# Patient Record
Sex: Female | Born: 1985 | Race: White | Hispanic: No | Marital: Married | Smoking: Never smoker
Health system: Southern US, Community
[De-identification: ages and names within clinical notes are randomized; demographics above are authoritative.]

## PROBLEM LIST (undated history)

## (undated) ENCOUNTER — Inpatient Hospital Stay (HOSPITAL_COMMUNITY): Payer: Self-pay

## (undated) DIAGNOSIS — Z8711 Personal history of peptic ulcer disease: Secondary | ICD-10-CM

## (undated) DIAGNOSIS — N201 Calculus of ureter: Secondary | ICD-10-CM

## (undated) DIAGNOSIS — Z87442 Personal history of urinary calculi: Secondary | ICD-10-CM

## (undated) DIAGNOSIS — Z8719 Personal history of other diseases of the digestive system: Secondary | ICD-10-CM

---
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
## 2005-11-02 ENCOUNTER — Emergency Department (HOSPITAL_COMMUNITY): Admission: EM | Admit: 2005-11-02 | Discharge: 2005-11-02 | Payer: Self-pay | Admitting: Emergency Medicine

## 2005-11-16 ENCOUNTER — Emergency Department (HOSPITAL_COMMUNITY): Admission: EM | Admit: 2005-11-16 | Discharge: 2005-11-16 | Payer: Self-pay | Admitting: Emergency Medicine

## 2005-11-24 ENCOUNTER — Emergency Department (HOSPITAL_COMMUNITY): Admission: EM | Admit: 2005-11-24 | Discharge: 2005-11-24 | Payer: Self-pay | Admitting: Emergency Medicine

## 2006-10-18 ENCOUNTER — Emergency Department (HOSPITAL_COMMUNITY): Admission: EM | Admit: 2006-10-18 | Discharge: 2006-10-18 | Payer: Self-pay | Admitting: Emergency Medicine

## 2006-11-22 ENCOUNTER — Emergency Department (HOSPITAL_COMMUNITY): Admission: EM | Admit: 2006-11-22 | Discharge: 2006-11-22 | Payer: Self-pay | Admitting: Family Medicine

## 2007-03-12 ENCOUNTER — Emergency Department (HOSPITAL_COMMUNITY): Admission: EM | Admit: 2007-03-12 | Discharge: 2007-03-12 | Payer: Self-pay | Admitting: Emergency Medicine

## 2007-10-16 ENCOUNTER — Ambulatory Visit (HOSPITAL_COMMUNITY): Admission: RE | Admit: 2007-10-16 | Discharge: 2007-10-16 | Payer: Self-pay | Admitting: Obstetrics and Gynecology

## 2007-10-31 ENCOUNTER — Inpatient Hospital Stay (HOSPITAL_COMMUNITY): Admission: AD | Admit: 2007-10-31 | Discharge: 2007-11-01 | Payer: Self-pay | Admitting: Obstetrics and Gynecology

## 2007-11-08 ENCOUNTER — Inpatient Hospital Stay (HOSPITAL_COMMUNITY): Admission: AD | Admit: 2007-11-08 | Discharge: 2007-11-08 | Payer: Self-pay | Admitting: Obstetrics and Gynecology

## 2007-11-19 ENCOUNTER — Inpatient Hospital Stay (HOSPITAL_COMMUNITY): Admission: AD | Admit: 2007-11-19 | Discharge: 2007-11-20 | Payer: Self-pay | Admitting: Obstetrics and Gynecology

## 2007-11-30 ENCOUNTER — Inpatient Hospital Stay (HOSPITAL_COMMUNITY): Admission: AD | Admit: 2007-11-30 | Discharge: 2007-11-30 | Payer: Self-pay | Admitting: Obstetrics and Gynecology

## 2007-12-03 ENCOUNTER — Inpatient Hospital Stay (HOSPITAL_COMMUNITY): Admission: AD | Admit: 2007-12-03 | Discharge: 2007-12-03 | Payer: Self-pay | Admitting: Obstetrics and Gynecology

## 2007-12-04 ENCOUNTER — Inpatient Hospital Stay (HOSPITAL_COMMUNITY): Admission: AD | Admit: 2007-12-04 | Discharge: 2007-12-04 | Payer: Self-pay | Admitting: Obstetrics & Gynecology

## 2007-12-05 ENCOUNTER — Inpatient Hospital Stay (HOSPITAL_COMMUNITY): Admission: AD | Admit: 2007-12-05 | Discharge: 2007-12-07 | Payer: Self-pay | Admitting: Obstetrics & Gynecology

## 2008-01-28 ENCOUNTER — Inpatient Hospital Stay (HOSPITAL_COMMUNITY): Admission: EM | Admit: 2008-01-28 | Discharge: 2008-01-31 | Payer: Self-pay | Admitting: Urology

## 2008-01-28 ENCOUNTER — Encounter: Payer: Self-pay | Admitting: Family Medicine

## 2008-01-28 HISTORY — PX: OTHER SURGICAL HISTORY: SHX169

## 2008-04-06 ENCOUNTER — Ambulatory Visit (HOSPITAL_BASED_OUTPATIENT_CLINIC_OR_DEPARTMENT_OTHER): Admission: RE | Admit: 2008-04-06 | Discharge: 2008-04-06 | Payer: Self-pay | Admitting: Urology

## 2008-04-06 HISTORY — PX: OTHER SURGICAL HISTORY: SHX169

## 2009-05-01 ENCOUNTER — Inpatient Hospital Stay (HOSPITAL_COMMUNITY): Admission: AD | Admit: 2009-05-01 | Discharge: 2009-05-01 | Payer: Self-pay | Admitting: Obstetrics and Gynecology

## 2009-06-09 ENCOUNTER — Inpatient Hospital Stay (HOSPITAL_COMMUNITY): Admission: AD | Admit: 2009-06-09 | Discharge: 2009-06-09 | Payer: Self-pay | Admitting: Obstetrics & Gynecology

## 2009-06-28 ENCOUNTER — Inpatient Hospital Stay (HOSPITAL_COMMUNITY): Admission: AD | Admit: 2009-06-28 | Discharge: 2009-06-28 | Payer: Self-pay | Admitting: Obstetrics & Gynecology

## 2009-07-25 ENCOUNTER — Inpatient Hospital Stay (HOSPITAL_COMMUNITY): Admission: AD | Admit: 2009-07-25 | Discharge: 2009-07-25 | Payer: Self-pay | Admitting: Obstetrics & Gynecology

## 2009-10-29 ENCOUNTER — Ambulatory Visit: Payer: Self-pay | Admitting: Physician Assistant

## 2009-10-29 ENCOUNTER — Inpatient Hospital Stay (HOSPITAL_COMMUNITY): Admission: AD | Admit: 2009-10-29 | Discharge: 2009-10-29 | Payer: Self-pay | Admitting: Obstetrics and Gynecology

## 2009-10-30 ENCOUNTER — Observation Stay (HOSPITAL_COMMUNITY): Admission: AD | Admit: 2009-10-30 | Discharge: 2009-10-30 | Payer: Self-pay | Admitting: Obstetrics and Gynecology

## 2009-11-09 ENCOUNTER — Inpatient Hospital Stay (HOSPITAL_COMMUNITY): Admission: AD | Admit: 2009-11-09 | Discharge: 2009-11-09 | Payer: Self-pay | Admitting: Obstetrics and Gynecology

## 2009-11-16 ENCOUNTER — Emergency Department (HOSPITAL_COMMUNITY): Admission: EM | Admit: 2009-11-16 | Discharge: 2009-11-16 | Payer: Self-pay | Admitting: Family Medicine

## 2009-11-20 ENCOUNTER — Inpatient Hospital Stay (HOSPITAL_COMMUNITY): Admission: AD | Admit: 2009-11-20 | Discharge: 2009-11-20 | Payer: Self-pay | Admitting: Obstetrics and Gynecology

## 2009-11-23 ENCOUNTER — Inpatient Hospital Stay (HOSPITAL_COMMUNITY): Admission: AD | Admit: 2009-11-23 | Discharge: 2009-11-23 | Payer: Self-pay | Admitting: Obstetrics and Gynecology

## 2009-11-30 ENCOUNTER — Inpatient Hospital Stay (HOSPITAL_COMMUNITY): Admission: AD | Admit: 2009-11-30 | Discharge: 2009-11-30 | Payer: Self-pay | Admitting: Obstetrics and Gynecology

## 2009-12-01 ENCOUNTER — Inpatient Hospital Stay (HOSPITAL_COMMUNITY): Admission: AD | Admit: 2009-12-01 | Discharge: 2009-12-01 | Payer: Self-pay | Admitting: Obstetrics and Gynecology

## 2009-12-03 ENCOUNTER — Inpatient Hospital Stay (HOSPITAL_COMMUNITY): Admission: AD | Admit: 2009-12-03 | Discharge: 2009-12-03 | Payer: Self-pay | Admitting: Obstetrics and Gynecology

## 2009-12-05 ENCOUNTER — Inpatient Hospital Stay (HOSPITAL_COMMUNITY): Admission: AD | Admit: 2009-12-05 | Discharge: 2009-12-08 | Payer: Self-pay | Admitting: Obstetrics and Gynecology

## 2010-02-07 ENCOUNTER — Emergency Department (HOSPITAL_COMMUNITY): Admission: EM | Admit: 2010-02-07 | Discharge: 2010-02-07 | Payer: Self-pay | Admitting: Family Medicine

## 2010-10-11 ENCOUNTER — Emergency Department (HOSPITAL_COMMUNITY)
Admission: EM | Admit: 2010-10-11 | Discharge: 2010-10-11 | Disposition: A | Discharge status: Home / Self Care | Attending: Emergency Medicine | Admitting: Emergency Medicine

## 2010-10-11 DIAGNOSIS — H6690 Otitis media, unspecified, unspecified ear: Secondary | ICD-10-CM | POA: Insufficient documentation

## 2010-10-11 DIAGNOSIS — J3489 Other specified disorders of nose and nasal sinuses: Secondary | ICD-10-CM | POA: Insufficient documentation

## 2010-10-11 DIAGNOSIS — H9190 Unspecified hearing loss, unspecified ear: Secondary | ICD-10-CM | POA: Insufficient documentation

## 2010-10-11 DIAGNOSIS — K219 Gastro-esophageal reflux disease without esophagitis: Secondary | ICD-10-CM | POA: Insufficient documentation

## 2010-10-11 DIAGNOSIS — H9209 Otalgia, unspecified ear: Secondary | ICD-10-CM | POA: Insufficient documentation

## 2010-10-30 LAB — URINALYSIS, ROUTINE W REFLEX MICROSCOPIC
Glucose, UA: NEGATIVE mg/dL
Leukocytes, UA: NEGATIVE
Leukocytes, UA: NEGATIVE
Nitrite: NEGATIVE
Protein, ur: >300 mg/dL — AB
Specific Gravity, Urine: >1.03 — ABNORMAL HIGH (ref 1.005–1.030)
Specific Gravity, Urine: >1.03 — ABNORMAL HIGH (ref 1.005–1.030)
Specific Gravity, Urine: >1.03 — ABNORMAL HIGH (ref 1.005–1.030)
Urobilinogen, UA: 0.2 mg/dL (ref 0.0–1.0)
Urobilinogen, UA: 0.2 mg/dL (ref 0.0–1.0)
pH: 6 (ref 5.0–8.0)
pH: 6 (ref 5.0–8.0)
pH: 6 (ref 5.0–8.0)

## 2010-10-30 LAB — RH IMMUNE GLOB WKUP(>/=20WKS)(NOT WOMEN'S HOSP)

## 2010-10-30 LAB — CBC
HCT: 23.8 % — ABNORMAL LOW (ref 36.0–46.0)
HCT: 28.5 % — ABNORMAL LOW (ref 36.0–46.0)
Hemoglobin: 9.4 g/dL — ABNORMAL LOW (ref 12.0–15.0)
Hemoglobin: 9.6 g/dL — ABNORMAL LOW (ref 12.0–15.0)
MCHC: 32.6 g/dL (ref 30.0–36.0)
MCHC: 32.9 g/dL (ref 30.0–36.0)
MCHC: 32.9 g/dL (ref 30.0–36.0)
MCV: 77.1 fL — ABNORMAL LOW (ref 78.0–100.0)
MCV: 78.3 fL (ref 78.0–100.0)
Platelets: 228 10*3/uL (ref 150–400)
Platelets: 247 10*3/uL (ref 150–400)
Platelets: 270 10*3/uL (ref 150–400)
Platelets: 283 10*3/uL (ref 150–400)
RBC: 3.72 MIL/uL — ABNORMAL LOW (ref 3.87–5.11)
RDW: 16.6 % — ABNORMAL HIGH (ref 11.5–15.5)
RDW: 16.6 % — ABNORMAL HIGH (ref 11.5–15.5)
RDW: 17.2 % — ABNORMAL HIGH (ref 11.5–15.5)
WBC: 11.6 10*3/uL — ABNORMAL HIGH (ref 4.0–10.5)
WBC: 11.9 10*3/uL — ABNORMAL HIGH (ref 4.0–10.5)
WBC: 13.6 10*3/uL — ABNORMAL HIGH (ref 4.0–10.5)

## 2010-10-30 LAB — COMPREHENSIVE METABOLIC PANEL
ALT: 27 U/L (ref 0–35)
AST: 28 U/L (ref 0–37)
AST: 28 U/L (ref 0–37)
AST: 40 U/L — ABNORMAL HIGH (ref 0–37)
Albumin: 2 g/dL — ABNORMAL LOW (ref 3.5–5.2)
Albumin: 2.5 g/dL — ABNORMAL LOW (ref 3.5–5.2)
Albumin: 2.7 g/dL — ABNORMAL LOW (ref 3.5–5.2)
Albumin: 2.7 g/dL — ABNORMAL LOW (ref 3.5–5.2)
BUN: 7 mg/dL (ref 6–23)
BUN: 7 mg/dL (ref 6–23)
Calcium: 8.6 mg/dL (ref 8.4–10.5)
Calcium: 8.8 mg/dL (ref 8.4–10.5)
Calcium: 8.8 mg/dL (ref 8.4–10.5)
Chloride: 105 mEq/L (ref 96–112)
Creatinine, Ser: 0.49 mg/dL (ref 0.4–1.2)
Creatinine, Ser: 0.61 mg/dL (ref 0.4–1.2)
GFR calc Af Amer: >60 mL/min (ref >60)
GFR calc Af Amer: >60 mL/min (ref >60)
GFR calc Af Amer: >60 mL/min (ref >60)
Glucose, Bld: 73 mg/dL (ref 70–99)
Glucose, Bld: 96 mg/dL (ref 70–99)
Potassium: 3.8 mEq/L (ref 3.5–5.1)
Potassium: 4 mEq/L (ref 3.5–5.1)
Potassium: 4.2 mEq/L (ref 3.5–5.1)
Sodium: 136 mEq/L (ref 135–145)
Sodium: 137 mEq/L (ref 135–145)
Total Protein: 4.8 g/dL — ABNORMAL LOW (ref 6.0–8.3)
Total Protein: 5.7 g/dL — ABNORMAL LOW (ref 6.0–8.3)
Total Protein: 6.1 g/dL (ref 6.0–8.3)

## 2010-10-30 LAB — URINE MICROSCOPIC-ADD ON

## 2010-10-30 LAB — LACTATE DEHYDROGENASE
LDH: 201 U/L (ref 94–250)
LDH: 202 U/L (ref 94–250)

## 2010-10-30 LAB — URIC ACID
Uric Acid, Serum: 6.1 mg/dL (ref 2.4–7.0)
Uric Acid, Serum: 6.5 mg/dL (ref 2.4–7.0)
Uric Acid, Serum: 6.6 mg/dL (ref 2.4–7.0)

## 2010-10-31 LAB — URINE MICROSCOPIC-ADD ON

## 2010-10-31 LAB — CBC
MCHC: 33 g/dL (ref 30.0–36.0)
MCV: 78.8 fL (ref 78.0–100.0)
Platelets: 310 10*3/uL (ref 150–400)
RDW: 15.7 % — ABNORMAL HIGH (ref 11.5–15.5)

## 2010-10-31 LAB — URINALYSIS, ROUTINE W REFLEX MICROSCOPIC
Glucose, UA: NEGATIVE mg/dL
Leukocytes, UA: NEGATIVE
Protein, ur: 30 mg/dL — AB
Specific Gravity, Urine: 1.025 (ref 1.005–1.030)

## 2010-10-31 LAB — COMPREHENSIVE METABOLIC PANEL
AST: 30 U/L (ref 0–37)
Albumin: 2.9 g/dL — ABNORMAL LOW (ref 3.5–5.2)
CO2: 23 mEq/L (ref 19–32)
Calcium: 8.9 mg/dL (ref 8.4–10.5)
Creatinine, Ser: 0.45 mg/dL (ref 0.4–1.2)
GFR calc Af Amer: >60 mL/min (ref >60)
GFR calc non Af Amer: >60 mL/min (ref >60)
Total Protein: 6.1 g/dL (ref 6.0–8.3)

## 2010-11-04 LAB — URINALYSIS, DIPSTICK ONLY
Bilirubin Urine: NEGATIVE
Ketones, ur: NEGATIVE mg/dL
Nitrite: NEGATIVE
Specific Gravity, Urine: <1.005 — ABNORMAL LOW (ref 1.005–1.030)
Urobilinogen, UA: 0.2 mg/dL (ref 0.0–1.0)

## 2010-11-04 LAB — URINALYSIS, ROUTINE W REFLEX MICROSCOPIC
Glucose, UA: NEGATIVE mg/dL
Ketones, ur: NEGATIVE mg/dL
Protein, ur: NEGATIVE mg/dL

## 2010-11-04 LAB — URINE CULTURE: Colony Count: 50000

## 2010-11-04 LAB — URINE MICROSCOPIC-ADD ON

## 2010-11-04 LAB — FETAL FIBRONECTIN: Fetal Fibronectin: NEGATIVE

## 2010-11-13 LAB — WET PREP, GENITAL
Clue Cells Wet Prep HPF POC: NONE SEEN
Yeast Wet Prep HPF POC: NONE SEEN

## 2010-11-13 LAB — URINALYSIS, ROUTINE W REFLEX MICROSCOPIC
Glucose, UA: NEGATIVE mg/dL
Ketones, ur: NEGATIVE mg/dL
Protein, ur: NEGATIVE mg/dL

## 2010-11-13 LAB — URINE MICROSCOPIC-ADD ON

## 2010-11-14 LAB — URINALYSIS, ROUTINE W REFLEX MICROSCOPIC
Bilirubin Urine: NEGATIVE
Ketones, ur: NEGATIVE mg/dL
Nitrite: NEGATIVE
pH: 7 (ref 5.0–8.0)

## 2010-11-14 LAB — URINE MICROSCOPIC-ADD ON

## 2010-11-16 LAB — WET PREP, GENITAL
Clue Cells Wet Prep HPF POC: NONE SEEN
Trich, Wet Prep: NONE SEEN

## 2010-12-25 NOTE — Op Note (Signed)
Regina Harrison, Regina Harrison                ACCOUNT NO.:  [id number]   MEDICAL RECORD NO.:  [id number]          PATIENT TYPE:  AMB   LOCATION:  [facility]                         FACILITY:  [facility]   PHYSICIAN:  Ronald L. Earlene Plater, M.D.  DATE OF BIRTH:  1985-11-03   DATE OF PROCEDURE:  04/06/2008  DATE OF DISCHARGE:                               OPERATIVE REPORT   PREOPERATIVE DIAGNOSIS:  Right ureterolithiasis with prior urosepsis  status post previous placement of right double-J stent on January 28, 2008.   POSTOPERATIVE DIAGNOSIS:  Right ureterolithiasis with prior urosepsis  status post previous placement of right double-J stent on January 28, 2008.   OPERATIVE PROCEDURE:  Cystourethroscopy, removal of right double-J  stent, ureteroscopy with Holmium laser lithotripsy and removal of stone  fragments with basket and replacement of right double-J stent.   SURGEON:  Laurin Coder, MD   ANESTHESIA:  LMA.   ESTIMATED BLOOD LOSS:  Negligible.   TUBES:  24 cm 7 [place] Contour double pigtail stent with a pullout  string.   COMPLICATIONS:  None.   INDICATIONS FOR PROCEDURE:  Regina Harrison is a lovely 25 year old white  female who originally presented in June to the hospital with right flank  pain, nausea, vomiting and fever.  She was subsequently seen in the  emergency room and had a white blood cell count of 17.3 and underwent,  on CT scan had a 5.4 x 3.8 x 6.6 mm right ureterovesical junction stone  with hydronephrosis.  She subsequently underwent placement of a stent,  for followup stone manipulation.  She was treated for the infection  appropriately.  Her son has had surgery so it slightly delayed this  procedure which was elective.   PROCEDURE IN DETAIL:  The patient was placed in supine position.  After  proper LMA anesthesia was placed in the dorsal lithotomy position,  prepped and draped with Betadine in a sterile fashion.  Cystourethroscopy was performed with a 22.5 [place] Olympus panendoscope.  The bladder was carefully inspected and noted to be without lesions.  The stent was identified and pulled to the meatus and a 0.038 French  sensor wire was placed in the right renal pelvis and the old stent was  removed.  There was really no encrustation on it.  Ureteroscopy was then  performed with a short thin ureteroscope and at the level of the vessels  the stone was identified.  It was quite large, yellow and appeared to be  impacted.  I utilized a 365 micron laser fiber on a setting of 0.5 and a  repetition rate of 5 the stone was fragmented into multiple fragments  and these fragments were serially extracted with a Nitinol basket and  inspection of the lower two-thirds of the ureter after this revealed  there were no significant fragments and no perforations.  The  ureteroscope was visually removed.  Stents will be given to her and  subsequently submitted for stone analysis.  It might be noted that  fluoroscopically a stone could not be well visualized.  It was felt to  be radiolucent.  Under fluoroscopic  guidance and direct vision a new 24  cm 7 [place] Contour double pigtail stent was  placed and noted to be in good position within the right renal pelvis  and within the bladder and pullout string was attached.  The bladder was  drained.  The pullout string was attached to the lower abdomen with tape  and the patient was taken to the recovery room stable.      Ronald L. Earlene Plater, M.D.  Electronically Signed     RLD/MEDQ  D:  04/06/2008  T:  04/06/2008  Job:  [id number]

## 2010-12-25 NOTE — H&P (Signed)
Regina Harrison, Regina Harrison                ACCOUNT NO.:  [id number]   MEDICAL RECORD NO.:  [id number]          PATIENT TYPE:  INP   LOCATION:  1434                         FACILITY:  [facility]   PHYSICIAN:  Ronald L. Earlene Plater, M.D.  DATE OF BIRTH:  [birth year]   DATE OF ADMISSION:  01/28/2008  DATE OF DISCHARGE:                              HISTORY & PHYSICAL   [age]-year-old white female with right flank pain starting on  Sunday, relieved with heat, shower.  The pain returned today at 06:00,  worsening and presented to emergency room at [facility] at  10:00 a.m.  Associated symptoms of sweats, chills.  No documented fever.  No hematuria or urinary symptoms.  Patient is 26 weeks postpartum with  resolved lochia and non breastfeeding.   PAST MEDICAL HISTORY:  Negative.   MEDICATIONS:  Percocet p.r.n.   ALLERGIES:  None known.   SOCIAL HISTORY:  Negative ETOH, tobacco or illicit drug use.  Lives in  [place].  Has 61-week-old and 91-year-old children.   FAMILY HISTORY:  Negative history of stones.   REVIEW OF SYSTEMS:  Positive right flank pain.  Otherwise benign.   PHYSICAL EXAMINATION:  A 25 year old Caucasian female.  She is in no  acute distress.  She is well groomed, well nourished, well developed.  CARDIAC:  Regular rate and rhythm.  LUNGS:  Clear to auscultation.  ABDOMEN:  Nonpalpable.  UTERUS:  Soft with positive right CVA tenderness.  GU:  Negative lochia.   LABORATORY DATA:  Uterine pregnancy negative.  White count 17.3,  hemoglobin 13.9, hematocrit 40, platelets 381.  INR of 1.0.  Sodium 139,  potassium 3.5, chloride 103, CO2 of 23, BUN 10, creatinine 0.94, glucose  132.  CT shows a right hydroureter hydronephrosis advancing to a  suspected right UVJ stone measuring 5.4 cm x 3.8 cm x 6.6 cm.   IMPRESSION:  Right obstructive uropathy.  Firm right ureterovesical  junction stone.   PLAN:  1. Nothing by mouth.  2. Pain medication.  3. Patient will go to  operating room by Dr. Darvin Neighbours for cystoscopy      and double J stent placement.   Dr. Earlene Plater also evaluated this patient.  Risks, benefits and alternatives  were discussed, and patient would like to proceed accordingly.     ______________________________  Alessandra Bevels. Chase Picket, FNP-C      Ronald L. Earlene Plater, M.D.  Electronically Signed    JML/MEDQ  D:  01/28/2008  T:  01/28/2008  Job:  [id number]

## 2010-12-25 NOTE — Discharge Summary (Signed)
Regina Harrison, Regina Harrison                ACCOUNT NO.:  [id number]   MEDICAL RECORD NO.:  [id number]          PATIENT TYPE:  INP   LOCATION:  1434                         FACILITY:  [facility]   PHYSICIAN:  Ronald L. Earlene Plater, M.D.  DATE OF BIRTH:  [birth year]   DATE OF ADMISSION:  01/28/2008  DATE OF DISCHARGE:  01/31/2008                               DISCHARGE SUMMARY   ADMISSION DIAGNOSES:  1. Flank pain.  2. Hematuria.  3. Bladder obstructive uropathy with renal ureteropelvic stone.   BRIEF HISTORY:  A 25 year old white female with right flank pain  starting on Sunday prior to coming to the ER relieved with heat and  shower.  The pain returned on June 18 at 0600, worsening, and she  presented to the ER at [facility] at 10 a.m.  Associated  symptoms were sweats, chills, and a documented fever, no blood or  urinary symptoms.   PAST MEDICAL HISTORY:  The patient is 8 weeks postpartum, no lochia, and  non-breastfeeding.   PAST SURGICAL HISTORY:  None.   ALLERGIES:  None known.   CT scan was performed in the ER, which shows right hydroureter and  nephrosis with UVJ stone on right side, 5.4 cm x 3.8 cm x 6.6 cm.  The  patient does have fairly significant nausea and vomiting.  Labs revealed  a white count of 17.3, hemoglobin 13.9, hematocrit 40, platelets 381.  Sodium 139, potassium 3.5, chloride 106, carbon dioxide 23, glucose 132,  BUN 10, and creatinine 0.94.   The patient was seen by Cammy Copa, Nurse Practitioner, and Dr.  Darvin Neighbours in consult, and we discussed with patient for need for right  double-J stent placement.  Risks, benefits, and alternatives were  discussed and she elected to proceed accordingly.   HOSPITAL COURSE:  A cysto right double-J stent was placed by Dr. Darvin Neighbours without difficulty.  The patient remained afebrile and feeling  better and continuing on gentamicin.  On postop day 2, the patient's  temperature was 99.4, other vitals remained stable.   On June 21, T-max  was 100, the patient's labs remained stable, patient clinically felt  better, and patient was sent home.   CONDITION ON DISCHARGE:  Improved.   DISCHARGE MEDICATIONS:  1. Tylenol every 4-6 hours as needed for pain.  2. Vicodin 1 every 4 hours as needed for pain.  3. Cipro 500 mg twice daily.   ACTIVITY:  Increase activity slowly.   DIET:  No restrictions.   WOUND CARE:  Not applicable.   FOLLOWUP:  With Jetta Lout, Nurse Practitioner, in 2 weeks.     ______________________________  Alessandra Bevels. Chase Picket, FNP-C      Ronald L. Earlene Plater, M.D.  Electronically Signed    JML/MEDQ  D:  03/03/2008  T:  03/03/2008  Job:  [id number]

## 2010-12-25 NOTE — Op Note (Signed)
[name], [name]                ACCOUNT NO.:  [id number]   MEDICAL RECORD NO.:  [id number]          PATIENT TYPE:  INP   LOCATION:  1434                         FACILITY:  [facility]   PHYSICIAN:  Ronald L. Earlene Plater, M.D.  DATE OF BIRTH:  Dec 23, 1985   DATE OF PROCEDURE:  01/28/2008  DATE OF DISCHARGE:                               OPERATIVE REPORT   DIAGNOSIS:  Right ureterolithiasis with impending urosepsis.   OPERATIVE PROCEDURE:  Cystourethroscopy, placement of right double-J  stent.   SURGEON:  Regina Harrison.   ANESTHESIA:  LMA.   ESTIMATED BLOOD LOSS:  Negligible.   TUBES:  24-cm 7-French contour double pigtail stent.   COMPLICATIONS:  None.   INDICATIONS FOR PROCEDURE:  [name] was a nice 25 year old white  female who presented with right flank pain, nausea, vomiting, fever.  She was subsequently seen in the [facility] emergency room and was found  have a white blood cell count 17.3, although her urine looked reasonably  normal.  Her creatinine was 0.94, and her BUN was 10.  CT scan revealed  a large right ureterovesical junction stone measuring 5.4 x 3.8 x 6.6-mm  with a hydroureteronephrosis.  With the high fever and severe pain, it  was felt that cystoscopy and placement of stent was indicated.  She was  given preoperative antibiotics.   PROCEDURE IN DETAIL:  The patient was placed supine position.  After  proper LMA anesthesia was placed in the dorsal lithotomy position,  prepped and draped with Betadine in sterile fashion.  Cystourethroscopy  was performed with a 22.5 [place] Olympus panendoscope.  The bladder was  carefully inspected and noted be without lesions.  Under fluoroscopic  guidance, a 0.03 French sensor wire was placed in the right renal  pelvis, and a flush of urine was noted.  It did not appear to be  purulent.  Under fluoroscopic guidance, a 24-cm 7-French Contour double  pigtail stent was placed and noted to be in good position within the  right  renal pelvis and within the bladder.  Stone could not be  visualized on fluoroscopy, but there was contrast in the area, and it  was difficult to see with the GI contrast.  The bladder was drained.  The panendoscope was removed, and the patient was taken to the recovery  room stable.      Ronald L. Earlene Plater, M.D.  Electronically Signed     RLD/MEDQ  D:  01/28/2008  T:  01/28/2008  Job:  [id number]

## 2011-04-08 ENCOUNTER — Emergency Department (HOSPITAL_COMMUNITY)
Admission: EM | Admit: 2011-04-08 | Discharge: 2011-04-08 | Disposition: A | Discharge status: Home / Self Care | Attending: Emergency Medicine | Admitting: Emergency Medicine

## 2011-04-08 DIAGNOSIS — R51 Headache: Secondary | ICD-10-CM | POA: Insufficient documentation

## 2011-04-08 DIAGNOSIS — R6883 Chills (without fever): Secondary | ICD-10-CM | POA: Insufficient documentation

## 2011-04-08 DIAGNOSIS — R599 Enlarged lymph nodes, unspecified: Secondary | ICD-10-CM | POA: Insufficient documentation

## 2011-04-08 DIAGNOSIS — J029 Acute pharyngitis, unspecified: Secondary | ICD-10-CM | POA: Insufficient documentation

## 2011-04-08 DIAGNOSIS — K219 Gastro-esophageal reflux disease without esophagitis: Secondary | ICD-10-CM | POA: Insufficient documentation

## 2011-04-08 LAB — RAPID STREP SCREEN (MED CTR MEBANE ONLY): Streptococcus, Group A Screen (Direct): NEGATIVE

## 2011-05-06 LAB — RH IMMUNE GLOBULIN WORKUP (NOT WOMEN'S HOSP)

## 2011-05-06 LAB — URINALYSIS, ROUTINE W REFLEX MICROSCOPIC
Glucose, UA: NEGATIVE
Ketones, ur: NEGATIVE
Leukocytes, UA: NEGATIVE
Nitrite: NEGATIVE
Protein, ur: NEGATIVE
pH: 6.5

## 2011-05-06 LAB — URINE MICROSCOPIC-ADD ON

## 2011-05-07 LAB — URINE MICROSCOPIC-ADD ON

## 2011-05-07 LAB — CBC
HCT: 30.8 — ABNORMAL LOW
HCT: 34.9 — ABNORMAL LOW
HCT: 35 — ABNORMAL LOW
Hemoglobin: 11.9 — ABNORMAL LOW
Hemoglobin: 12.3
MCHC: 34.8
MCHC: 35.1
MCHC: 35.4
MCV: 87.5
MCV: 88.5
MCV: 89.1
Platelets: 228
Platelets: 248
RBC: 3.88
RBC: 3.98
RDW: 13.4
RDW: 13.8
WBC: 11.6 — ABNORMAL HIGH
WBC: 12.7 — ABNORMAL HIGH
WBC: 12.7 — ABNORMAL HIGH

## 2011-05-07 LAB — URINALYSIS, ROUTINE W REFLEX MICROSCOPIC
Bilirubin Urine: NEGATIVE
Bilirubin Urine: NEGATIVE
Glucose, UA: NEGATIVE
Glucose, UA: NEGATIVE
Protein, ur: NEGATIVE
Specific Gravity, Urine: 1.02
pH: 6.5

## 2011-05-07 LAB — COMPREHENSIVE METABOLIC PANEL
ALT: 19
ALT: 19
AST: 23
Alkaline Phosphatase: 120 — ABNORMAL HIGH
Alkaline Phosphatase: 132 — ABNORMAL HIGH
BUN: 8
CO2: 23
CO2: 23
Calcium: 9.4
Chloride: 103
GFR calc Af Amer: >60
GFR calc non Af Amer: >60
GFR calc non Af Amer: >60
Glucose, Bld: 65 — ABNORMAL LOW
Glucose, Bld: 81
Potassium: 3.7
Potassium: 4
Sodium: 132 — ABNORMAL LOW
Sodium: 135

## 2011-05-07 LAB — CREATININE CLEARANCE, URINE, 24 HOUR
Collection Interval-CRCL: 24
Creatinine Clearance: 130 — ABNORMAL HIGH
Creatinine, 24H Ur: 1050
Creatinine, Urine: 131.2

## 2011-05-07 LAB — RH IMMUNE GLOB WKUP(>/=20WKS)(NOT WOMEN'S HOSP)

## 2011-05-07 LAB — LACTATE DEHYDROGENASE: LDH: 203

## 2011-05-09 LAB — URINE MICROSCOPIC-ADD ON

## 2011-05-09 LAB — URINALYSIS, ROUTINE W REFLEX MICROSCOPIC
Glucose, UA: NEGATIVE
Hgb urine dipstick: NEGATIVE
Protein, ur: 30 — AB

## 2011-05-09 LAB — DIFFERENTIAL
Basophils Absolute: 0
Basophils Relative: 0
Eosinophils Absolute: 0
Eosinophils Relative: 0
Lymphocytes Relative: 8 — ABNORMAL LOW
Lymphs Abs: 1.4
Monocytes Absolute: 1
Monocytes Relative: 8
Neutro Abs: 15 — ABNORMAL HIGH
Neutrophils Relative %: 87 — ABNORMAL HIGH

## 2011-05-09 LAB — COMPREHENSIVE METABOLIC PANEL
AST: 22
Albumin: 2.9 — ABNORMAL LOW
BUN: 10
CO2: 23
CO2: 30
Calcium: 10
Calcium: 8.3 — ABNORMAL LOW
Chloride: 106
Creatinine, Ser: 0.94
Creatinine, Ser: 0.9
GFR calc Af Amer: >60
GFR calc Af Amer: >60
GFR calc non Af Amer: >60
GFR calc non Af Amer: >60
Total Bilirubin: 0.8

## 2011-05-09 LAB — CBC
HCT: 40
HCT: 33.2 — ABNORMAL LOW
HCT: 36.7
Hemoglobin: 11.5 — ABNORMAL LOW
Hemoglobin: 12.4
MCHC: 34.9
MCHC: 34
MCHC: 34.7
MCV: 85.2
MCV: 85
MCV: 86.2
MCV: 86.7
Platelets: 204
Platelets: 218
RBC: 4.7
RBC: 3.91
RBC: 4.25
WBC: 17.3 — ABNORMAL HIGH
WBC: 4.7

## 2011-05-09 LAB — URINE CULTURE

## 2011-05-09 LAB — BASIC METABOLIC PANEL
BUN: 2 — ABNORMAL LOW
Chloride: 105
Potassium: 3.4 — ABNORMAL LOW
Sodium: 142

## 2011-05-09 LAB — PROTIME-INR
INR: 1
Prothrombin Time: 13.5

## 2011-05-09 LAB — LIPASE, BLOOD: Lipase: 18

## 2011-05-09 LAB — CULTURE, BLOOD (ROUTINE X 2): Culture: NO GROWTH

## 2011-05-27 LAB — STREP A DNA PROBE: Group A Strep Probe: NEGATIVE

## 2012-03-14 ENCOUNTER — Encounter (HOSPITAL_COMMUNITY): Payer: Self-pay | Admitting: *Deleted

## 2012-03-14 DIAGNOSIS — F17200 Nicotine dependence, unspecified, uncomplicated: Secondary | ICD-10-CM | POA: Insufficient documentation

## 2012-03-14 DIAGNOSIS — N390 Urinary tract infection, site not specified: Secondary | ICD-10-CM | POA: Insufficient documentation

## 2012-03-14 LAB — COMPREHENSIVE METABOLIC PANEL
ALT: 12 U/L (ref 0–35)
Albumin: 4.6 g/dL (ref 3.5–5.2)
Alkaline Phosphatase: 81 U/L (ref 39–117)
BUN: 9 mg/dL (ref 6–23)
Calcium: 9.5 mg/dL (ref 8.4–10.5)
GFR calc Af Amer: >90 mL/min (ref >90)
Glucose, Bld: 95 mg/dL (ref 70–99)
Potassium: 3.4 mEq/L — ABNORMAL LOW (ref 3.5–5.1)
Sodium: 139 mEq/L (ref 135–145)
Total Protein: 8.2 g/dL (ref 6.0–8.3)

## 2012-03-14 LAB — URINE MICROSCOPIC-ADD ON

## 2012-03-14 LAB — URINALYSIS, ROUTINE W REFLEX MICROSCOPIC
Bilirubin Urine: NEGATIVE
Ketones, ur: NEGATIVE mg/dL
Nitrite: NEGATIVE
Protein, ur: NEGATIVE mg/dL
Urobilinogen, UA: 0.2 mg/dL (ref 0.0–1.0)

## 2012-03-14 LAB — CBC WITH DIFFERENTIAL/PLATELET
Basophils Relative: 0 % (ref 0–1)
Eosinophils Absolute: 0.3 10*3/uL (ref 0.0–0.7)
Eosinophils Relative: 2 % (ref 0–5)
MCH: 29.5 pg (ref 26.0–34.0)
MCHC: 35.5 g/dL (ref 30.0–36.0)
MCV: 83.2 fL (ref 78.0–100.0)
Neutrophils Relative %: 60 % (ref 43–77)
Platelets: 287 10*3/uL (ref 150–400)

## 2012-03-14 NOTE — ED Notes (Signed)
The pt has had lt flank pain today but she has had some urinary frequency adn voiding in small amounts for 2-3 days.  lmp jjuly 5th.   diarrhea

## 2012-03-15 ENCOUNTER — Emergency Department (HOSPITAL_COMMUNITY)
Admission: EM | Admit: 2012-03-15 | Discharge: 2012-03-15 | Disposition: A | Discharge status: Home / Self Care | Payer: Self-pay | Attending: Emergency Medicine | Admitting: Emergency Medicine

## 2012-03-15 ENCOUNTER — Emergency Department (HOSPITAL_COMMUNITY): Payer: Self-pay

## 2012-03-15 DIAGNOSIS — N390 Urinary tract infection, site not specified: Secondary | ICD-10-CM

## 2012-03-15 MED ORDER — CEPHALEXIN 500 MG PO CAPS: 500.0000 mg | ORAL_CAPSULE | Freq: Three times a day (TID) | ORAL | Status: AC

## 2012-03-15 MED ORDER — ONDANSETRON HCL 4 MG/2ML IJ SOLN
4.0000 mg | Freq: Once | INTRAMUSCULAR | Status: AC
  Administered 2012-03-15: 4 mg via INTRAVENOUS
  Filled 2012-03-15: qty 2

## 2012-03-15 MED ORDER — DEXTROSE 5 % IV SOLN
1.0000 g | INTRAVENOUS | Status: DC
  Administered 2012-03-15: 1 g via INTRAVENOUS
  Filled 2012-03-15: qty 10

## 2012-03-15 MED ORDER — METOCLOPRAMIDE HCL 10 MG PO TABS: 10.0000 mg | ORAL_TABLET | Freq: Four times a day (QID) | ORAL | Status: DC | PRN

## 2012-03-15 MED ORDER — SODIUM CHLORIDE 0.9 % IV BOLUS (SEPSIS)
1000.0000 mL | Freq: Once | INTRAVENOUS | Status: AC
  Administered 2012-03-15: 1000 mL via INTRAVENOUS

## 2012-03-15 MED ORDER — SODIUM CHLORIDE 0.9 % IV SOLN
Freq: Once | INTRAVENOUS | Status: AC
  Administered 2012-03-15: 03:00:00 via INTRAVENOUS

## 2012-03-15 MED ORDER — OXYCODONE-ACETAMINOPHEN 5-325 MG PO TABS: 1.0000 | ORAL_TABLET | ORAL | Status: AC | PRN

## 2012-03-15 MED ORDER — KETOROLAC TROMETHAMINE 30 MG/ML IJ SOLN
30.0000 mg | Freq: Once | INTRAMUSCULAR | Status: AC
  Administered 2012-03-15: 30 mg via INTRAVENOUS
  Filled 2012-03-15: qty 1

## 2012-03-15 NOTE — ED Notes (Signed)
Pt denies pain or nausea at this time.  Requesting food.

## 2012-03-15 NOTE — ED Notes (Signed)
Patient transported to CT 

## 2012-03-15 NOTE — ED Notes (Signed)
Pt to CT

## 2012-03-15 NOTE — ED Notes (Signed)
Pt back from ct

## 2012-03-15 NOTE — ED Provider Notes (Signed)
History     CSN: [id number]  Arrival date & time 03/14/12  2229   First MD Initiated Contact with Patient 03/15/12 [phone]      Chief Complaint  Patient presents with  . Flank Pain    (Consider location/radiation/quality/duration/timing/severity/associated sxs/prior treatment) Patient is a 26 y.o. female presenting with flank pain. The history is provided by the patient.  Flank Pain  She had onset this evening of moderately severe left flank pain. Pain waxes and wanes. It is dull and crampy. Pain is 3/10 currently, but has been as severe as 8/10. There is associated nausea but no vomiting. She's not had any fever or chills or sweats. For the last 3 days, she has had some urinary urgency and frequency with no dysuria. She has a history of kidney stones and states that this feels similar to what her kidney stone felt like. She has not taken anything at home to help it. Nothing makes it better nothing makes it worse.  Past Medical History  Diagnosis Date  . Renal disorder     History reviewed. No pertinent past surgical history.  No family history on file.  History  Substance Use Topics  . Smoking status: Current Everyday Smoker  . Smokeless tobacco: Not on file  . Alcohol Use: Yes    OB History    Grav Para Term Preterm Abortions TAB SAB Ect Mult Living                  Review of Systems  Genitourinary: Positive for flank pain.  All other systems reviewed and are negative.    Allergies  Review of patient's allergies indicates no known allergies.  Home Medications   Current Outpatient Rx  Name Route Sig Dispense Refill  . IBUPROFEN 200 MG PO TABS Oral Take 800 mg by mouth once.      BP 132/100  Pulse 90  Temp 98.2 F (36.8 C) (Oral)  Resp 16  SpO2 100%  LMP 02/14/2012  Physical Exam  Nursing note and vitals reviewed.  27 year old female who is resting comfortably in no acute distress. Vital signs are significant for hypertension with blood pressure  132/100. Pulse is reported to be 90, but is 120 on my exam which is tachycardic. Oxygen saturation is 100% which is normal. Head is normocephalic and atraumatic. PERRLA, EOMI. Neck is nontender and supple. Back has no midline tenderness but there is moderate left CVA tenderness. Lungs are clear without rales, wheezes, rhonchi. Heart has regular rate and rhythm which is tachycardic, no murmur heard. Abdomen is soft, flat, nontender without masses or hepatosplenomegaly. Peristalsis is decreased. Extremities have no cyanosis or edema, full range of motion is present. Skin is warm and dry without rash. Neurologic: Mental status is normal, cranial nerves are intact, there no motor or sensory deficits.  ED Course  Procedures (including critical care time)  Results for orders placed during the hospital encounter of 03/15/12  URINALYSIS, ROUTINE W REFLEX MICROSCOPIC      Component Value Range   Color, Urine YELLOW  YELLOW   APPearance CLOUDY (*) CLEAR   Specific Gravity, Urine 1.009  1.005 - 1.030   pH 6.5  5.0 - 8.0   Glucose, UA NEGATIVE  NEGATIVE mg/dL   Hgb urine dipstick LARGE (*) NEGATIVE   Bilirubin Urine NEGATIVE  NEGATIVE   Ketones, ur NEGATIVE  NEGATIVE mg/dL   Protein, ur NEGATIVE  NEGATIVE mg/dL   Urobilinogen, UA 0.2  0.0 - 1.0 mg/dL   Nitrite  NEGATIVE  NEGATIVE   Leukocytes, UA MODERATE (*) NEGATIVE  PREGNANCY, URINE      Component Value Range   Preg Test, Ur NEGATIVE  NEGATIVE  CBC WITH DIFFERENTIAL      Component Value Range   WBC 13.2 (*) 4.0 - 10.5 K/uL   RBC 4.64  3.87 - 5.11 MIL/uL   Hemoglobin 13.7  12.0 - 15.0 g/dL   HCT 45.4  09.8 - 11.9 %   MCV 83.2  78.0 - 100.0 fL   MCH 29.5  26.0 - 34.0 pg   MCHC 35.5  30.0 - 36.0 g/dL   RDW 14.7  82.9 - 56.2 %   Platelets 287  150 - 400 K/uL   Neutrophils Relative 60  43 - 77 %   Neutro Abs 8.0 (*) 1.7 - 7.7 K/uL   Lymphocytes Relative 29  12 - 46 %   Lymphs Abs 3.9  0.7 - 4.0 K/uL   Monocytes Relative 8  3 - 12 %    Monocytes Absolute 1.0  0.1 - 1.0 K/uL   Eosinophils Relative 2  0 - 5 %   Eosinophils Absolute 0.3  0.0 - 0.7 K/uL   Basophils Relative 0  0 - 1 %   Basophils Absolute 0.0  0.0 - 0.1 K/uL  COMPREHENSIVE METABOLIC PANEL      Component Value Range   Sodium 139  135 - 145 mEq/L   Potassium 3.4 (*) 3.5 - 5.1 mEq/L   Chloride 102  96 - 112 mEq/L   CO2 24  19 - 32 mEq/L   Glucose, Bld 95  70 - 99 mg/dL   BUN 9  6 - 23 mg/dL   Creatinine, Ser 1.30  0.50 - 1.10 mg/dL   Calcium 9.5  8.4 - 86.5 mg/dL   Total Protein 8.2  6.0 - 8.3 g/dL   Albumin 4.6  3.5 - 5.2 g/dL   AST 21  0 - 37 U/L   ALT 12  0 - 35 U/L   Alkaline Phosphatase 81  39 - 117 U/L   Total Bilirubin 0.4  0.3 - 1.2 mg/dL   GFR calc non Af Amer >90  >90 mL/min   GFR calc Af Amer >90  >90 mL/min  URINE MICROSCOPIC-ADD ON      Component Value Range   Squamous Epithelial / LPF FEW (*) RARE   WBC, UA 21-50  <3 WBC/hpf   RBC / HPF 3-6  <3 RBC/hpf   Bacteria, UA FEW (*) RARE   Ct Abdomen Pelvis Wo Contrast  03/15/2012  *RADIOLOGY REPORT*  Clinical Data: Left flank pain.  Urinary frequency.  Diarrhea.  CT ABDOMEN AND PELVIS WITHOUT CONTRAST  Technique:  Multidetector CT imaging of the abdomen and pelvis was performed following the standard protocol without intravenous contrast.  Comparison: 01/28/2008  Findings: The lung bases are clear.  Punctate nonobstructing stone in the lower pole of the left kidney. No pyelocaliectasis or ureterectasis on either side.  No ureteral stones.  No bladder stones or bladder wall thickening.  The unenhanced appearance of the liver, spleen, gallbladder, pancreas, adrenal glands, abdominal aorta, and retroperitoneal lymph nodes is unremarkable.  Small umbilical hernia containing fat, stable.  No free air or free fluid in the abdomen.  The stomach, small bowel, and colon are not abnormally distended.  Pelvis:  Uterus and adnexal structures are not enlarged.  No free or loculated pelvic fluid collections.   Appendix is normal.  No evidence of diverticulitis. Pelvic  calcifications consistent with phleboliths.  Lumbar scoliosis.  IMPRESSION: Punctate nonobstructing stone in the lower pole left kidney.  No ureteral stone or obstruction identified.  Original Report Authenticated By: Marlon Pel, M.D.    Images viewed by me.   1. Urinary tract infection       MDM  Flank pain which could represent either urinary infection or ureteral calculus. Prior records are reviewed and she did have a right ureteral calculus of 4 years ago. She'll be given IV fluids and IV ketorolac and ondansetron.  Urinalysis shows significant pyuria oh with only minimal bacteria and hematuria. At this point, it is felt that she most likely has a urinary tract infection with possible early pyelonephritis, but will be sent for CT scan to evaluate for possible ureteral calculus.  CT shows nonobstructing nephrolithiasis. She's given a dose of Rocephin and will be sent home with prescriptions for Keflex, metoclopramide, and oxycodone-acetaminophen. She got good relief of pain with ketorolac in good relief of nausea with ondansetron.     Dione Booze, MD 03/15/12 [phone]

## 2012-03-15 NOTE — ED Notes (Signed)
Pt c/o flank pain which she reports to be similar to a prior kidney stone. She reports urinary frequency and pain while voiding. No apparent distress noted.

## 2012-03-17 LAB — URINE CULTURE

## 2012-03-18 NOTE — ED Notes (Signed)
+   Urine Patient treated with keflex-sensitive to same-chart sent to EDP office for review.

## 2012-08-12 NOTE — L&D Delivery Note (Signed)
Delivery Note Pt progressed to complete and pushed fair.  At  a viable unspecified sex was delivered via Vaginal, Spontaneous Delivery (Presentation: Left Occiput Anterior).  APGAR: 8, 9; weight pending.   Placenta status: Intact, Spontaneous.  Cord:  with the following complications: None.  Anesthesia: Epidural  Episiotomy: None Lacerations: None Suture Repair: none Est. Blood Loss (mL): 300  Mom to postpartum.  Baby to stay with mom.  Samina Weekes D 04/13/2013, 1:56 PM

## 2012-09-26 ENCOUNTER — Inpatient Hospital Stay (HOSPITAL_COMMUNITY)
Admission: AD | Admit: 2012-09-26 | Discharge: 2012-09-26 | Disposition: A | Discharge status: Home / Self Care | Source: Ambulatory Visit | Attending: Obstetrics & Gynecology | Admitting: Obstetrics & Gynecology

## 2012-09-26 ENCOUNTER — Encounter (HOSPITAL_COMMUNITY): Payer: Self-pay | Admitting: Obstetrics and Gynecology

## 2012-09-26 DIAGNOSIS — R109 Unspecified abdominal pain: Secondary | ICD-10-CM | POA: Insufficient documentation

## 2012-09-26 DIAGNOSIS — O210 Mild hyperemesis gravidarum: Secondary | ICD-10-CM | POA: Insufficient documentation

## 2012-09-26 DIAGNOSIS — O219 Vomiting of pregnancy, unspecified: Secondary | ICD-10-CM

## 2012-09-26 LAB — URINALYSIS, ROUTINE W REFLEX MICROSCOPIC
Glucose, UA: NEGATIVE mg/dL
Leukocytes, UA: NEGATIVE
Nitrite: NEGATIVE
Specific Gravity, Urine: 1.02 (ref 1.005–1.030)
pH: 6.5 (ref 5.0–8.0)

## 2012-09-26 LAB — URINE MICROSCOPIC-ADD ON

## 2012-09-26 LAB — POCT PREGNANCY, URINE: Preg Test, Ur: POSITIVE — AB

## 2012-09-26 MED ORDER — ONDANSETRON HCL 4 MG PO TABS: 4.0000 mg | ORAL_TABLET | Freq: Four times a day (QID) | ORAL | Status: DC

## 2012-09-26 MED ORDER — PROMETHAZINE HCL 25 MG PO TABS: 25.0000 mg | ORAL_TABLET | Freq: Four times a day (QID) | ORAL | Status: DC | PRN

## 2012-09-26 MED ORDER — ONDANSETRON 8 MG PO TBDP
8.0000 mg | ORAL_TABLET | Freq: Once | ORAL | Status: AC
  Administered 2012-09-26: 8 mg via ORAL
  Filled 2012-09-26: qty 1

## 2012-09-26 NOTE — MAU Note (Signed)
Regina Harrison is here for a pregnancy verification letter to apply for Insurance. She is [redacted]w[redacted]d; according to the LMP. She also complains of N/V related to pregnancy and a constant upper abdominal pain that is dull. She is a Physiological scientist. Denies bleeding or abnormal vagina discharge.

## 2012-09-26 NOTE — MAU Provider Note (Signed)
History     CSN: [id number]  Arrival date and time: 09/26/12 1914   First Provider Initiated Contact with Patient 09/26/12 1053      Chief Complaint  Patient presents with  . Possible Pregnancy  . Abdominal Pain   HPI Regina Harrison is a 27 y.o. [id number] at [redacted]w[redacted]d who presents to MAU today with complaint of N/V. This has been consistent throughout the pregnancy but worse the last few days. She does complain of some upper abdominal discomfort. She denies lower abdominal discomfort except for just prior to BM. This is relieved by BM. She denies fever, abnormal discharge, vaginal bleeding or UTI s/s. She plans to start care with [facility] when medicaid is approved.   OB History   Grav Para Term Preterm Abortions TAB SAB Ect Mult Living   6 3 1 2 2  0 2 0 0 3      Past Medical History  Diagnosis Date  . Renal disorder   . Hypertension     History reviewed. No pertinent past surgical history.  History reviewed. No pertinent family history.  History  Substance Use Topics  . Smoking status: Never Smoker   . Smokeless tobacco: Not on file  . Alcohol Use: Yes    Allergies: No Known Allergies  No prescriptions prior to admission    Review of Systems  Constitutional: Negative for fever, chills and malaise/fatigue.  Gastrointestinal: Positive for nausea, vomiting and abdominal pain. Negative for heartburn, diarrhea and constipation.  Genitourinary: Negative for dysuria, urgency and frequency.       Denies vaginal bleeding Denies abnormal discharge   Physical Exam   Blood pressure 135/79, pulse 118, resp. rate 18, last menstrual period 08/04/2012.  Physical Exam  Constitutional: She is oriented to person, place, and time. She appears well-developed and well-nourished. No distress.  HENT:  Head: Normocephalic and atraumatic.  Cardiovascular: Normal rate, regular rhythm and normal heart sounds.   Respiratory: Effort normal and breath sounds normal. No  respiratory distress.  GI: Soft. Bowel sounds are normal. She exhibits no distension and no mass. There is tenderness (mild tenderness to palpation of the epigastric region). There is no rebound and no guarding.  Neurological: She is alert and oriented to person, place, and time.  Skin: Skin is warm and dry. No erythema.  Psychiatric: She has a normal mood and affect.   Results for orders placed during the hospital encounter of 09/26/12 (from the past 24 hour(s))  URINALYSIS, ROUTINE W REFLEX MICROSCOPIC     Status: Abnormal   Collection Time    09/26/12  9:25 AM      Result Value Range   Color, Urine YELLOW  YELLOW   APPearance CLEAR  CLEAR   Specific Gravity, Urine 1.020  1.005 - 1.030   pH 6.5  5.0 - 8.0   Glucose, UA NEGATIVE  NEGATIVE mg/dL   Hgb urine dipstick TRACE (*) NEGATIVE   Bilirubin Urine NEGATIVE  NEGATIVE   Ketones, ur NEGATIVE  NEGATIVE mg/dL   Protein, ur NEGATIVE  NEGATIVE mg/dL   Urobilinogen, UA 0.2  0.0 - 1.0 mg/dL   Nitrite NEGATIVE  NEGATIVE   Leukocytes, UA NEGATIVE  NEGATIVE  URINE MICROSCOPIC-ADD ON     Status: Abnormal   Collection Time    09/26/12  9:25 AM      Result Value Range   Squamous Epithelial / LPF FEW (*) RARE   WBC, UA 0-2  <3 WBC/hpf   RBC / HPF  0-2  <3 RBC/hpf   Bacteria, UA FEW (*) RARE  POCT PREGNANCY, URINE     Status: Abnormal   Collection Time    09/26/12  9:38 AM      Result Value Range   Preg Test, Ur POSITIVE (*) NEGATIVE    MAU Course  Procedures None  MDM ODT Zofran in MAU. Patient feels much better. Able to take in PO.   Assessment and Plan  A: Nausea and Vomiting in pregnancy  P: Discharge home Call [facility] to set up appointment to start prenatal care Rx for zofran and phenergan sent to patient's pharmacy Pregnancy verification letter given  Freddi Starr, PA-C 09/26/2012, 10:54 AM

## 2012-10-26 ENCOUNTER — Encounter (HOSPITAL_COMMUNITY): Payer: Self-pay | Admitting: *Deleted

## 2012-10-28 ENCOUNTER — Inpatient Hospital Stay (HOSPITAL_COMMUNITY)
Admission: AD | Admit: 2012-10-28 | Discharge: 2012-10-28 | Disposition: A | Discharge status: Home / Self Care | Payer: Self-pay | Source: Ambulatory Visit | Attending: Family Medicine | Admitting: Family Medicine

## 2012-10-28 ENCOUNTER — Encounter (HOSPITAL_COMMUNITY): Payer: Self-pay | Admitting: Family

## 2012-10-28 DIAGNOSIS — R51 Headache: Secondary | ICD-10-CM | POA: Insufficient documentation

## 2012-10-28 DIAGNOSIS — N949 Unspecified condition associated with female genital organs and menstrual cycle: Secondary | ICD-10-CM

## 2012-10-28 DIAGNOSIS — O99891 Other specified diseases and conditions complicating pregnancy: Secondary | ICD-10-CM | POA: Insufficient documentation

## 2012-10-28 DIAGNOSIS — R109 Unspecified abdominal pain: Secondary | ICD-10-CM | POA: Insufficient documentation

## 2012-10-28 LAB — URINALYSIS, ROUTINE W REFLEX MICROSCOPIC
Bilirubin Urine: NEGATIVE
Glucose, UA: NEGATIVE mg/dL
Protein, ur: NEGATIVE mg/dL
Urobilinogen, UA: 0.2 mg/dL (ref 0.0–1.0)

## 2012-10-28 LAB — URINE MICROSCOPIC-ADD ON

## 2012-10-28 NOTE — MAU Provider Note (Signed)
Chart reviewed and agree with management and plan.  

## 2012-10-28 NOTE — MAU Note (Signed)
Patient states she has had mid and lower abdominal pain off and on for about one week. States "upset stomach" but no nausea, vomiting or diarrhea. Has on and off headaches. Denies bleeding or vaginal discharge.

## 2012-10-28 NOTE — MAU Provider Note (Signed)
History     CSN: [id number]  Arrival date and time: 10/28/12 1011   First Provider Initiated Contact with Patient 10/28/12 1208      Chief Complaint  Patient presents with  . Headache  . Abdominal Pain   HPI Regina Harrison 27 y.o. [redacted]w[redacted]d  Comes to MAU today with several symtoms.  Has not yet started prenatal care.  Is awaiting Medicaid card.  Main concern is bialteral, intermittent sharp shooting lower abdominal pain which radiates downward through her groin.  Occurs sporatically and if she just pauses and breathes it will resolve spontaneously.  She has had 2 previous miscarriages and is worried that this may be a miscarriage.  OB History   Grav Para Term Preterm Abortions TAB SAB Ect Mult Living   6 3 1 2 2  0 2 0 0 3      Past Medical History  Diagnosis Date  . Renal disorder   . Hypertension   . Preeclampsia     Past Surgical History  Procedure Laterality Date  . Kidney stone surgery      History reviewed. No pertinent family history.  History  Substance Use Topics  . Smoking status: Never Smoker   . Smokeless tobacco: Not on file  . Alcohol Use: Yes    Allergies: No Known Allergies  Prescriptions prior to admission  Medication Sig Dispense Refill  . acetaminophen (TYLENOL) 325 MG tablet Take 650 mg by mouth daily as needed for pain.      . prenatal vitamin w/FE, FA (NATACHEW) 29-1 MG CHEW Chew 2 tablets by mouth every morning.        Review of Systems  Constitutional: Negative for fever.  Gastrointestinal: Positive for abdominal pain. Negative for nausea, vomiting, diarrhea and constipation.  Genitourinary:       No vaginal discharge. No vaginal bleeding. No dysuria.  Neurological: Positive for headaches.   Physical Exam   Blood pressure 112/73, pulse 102, temperature 98.7 F (37.1 C), temperature source Oral, resp. rate 16, height 5' 2.5" (1.588 m), weight 171 lb 9.6 oz (77.837 kg), last menstrual period 08/04/2012, SpO2 99.00%.  Physical Exam   Nursing note and vitals reviewed. Constitutional: She is oriented to person, place, and time. She appears well-developed and well-nourished.  HENT:  Head: Normocephalic.  Eyes: EOM are normal.  Neck: Neck supple.  GI: Soft. There is tenderness. There is no rebound and no guarding.  Tenderness in bilateral lower quadrants near groin  Genitourinary:  Client declines pelvic exam and cervical exam. Needs to pick up her child from early release at school today.  Musculoskeletal: Normal range of motion.  Neurological: She is alert and oriented to person, place, and time.  Skin: Skin is warm and dry.  Psychiatric: She has a normal mood and affect.    MAU Course  Procedures  MDM Results for orders placed during the hospital encounter of 10/28/12 (from the past 24 hour(s))  URINALYSIS, ROUTINE W REFLEX MICROSCOPIC     Status: Abnormal   Collection Time    10/28/12 10:45 AM      Result Value Range   Color, Urine YELLOW  YELLOW   APPearance CLEAR  CLEAR   Specific Gravity, Urine 1.010  1.005 - 1.030   pH 6.5  5.0 - 8.0   Glucose, UA NEGATIVE  NEGATIVE mg/dL   Hgb urine dipstick TRACE (*) NEGATIVE   Bilirubin Urine NEGATIVE  NEGATIVE   Ketones, ur NEGATIVE  NEGATIVE mg/dL   Protein, ur NEGATIVE  NEGATIVE mg/dL   Urobilinogen, UA 0.2  0.0 - 1.0 mg/dL   Nitrite NEGATIVE  NEGATIVE   Leukocytes, UA NEGATIVE  NEGATIVE  URINE MICROSCOPIC-ADD ON     Status: Abnormal   Collection Time    10/28/12 10:45 AM      Result Value Range   Squamous Epithelial / LPF FEW (*) RARE   RBC / HPF 0-2  <3 RBC/hpf   Bacteria, UA RARE  RARE    Assessment and Plan  Round ligament pain  Plan Begin prenatal care as soon as possible. Drink at least 8 8-oz glasses of water every day. Take Tylenol 325 mg 2 tablets by mouth every 4 hours if needed for pain.   BURLESON,TERRI 10/28/2012, 12:18 PM

## 2012-10-28 NOTE — MAU Note (Addendum)
Patient presents to MAU with c/o intermittent bilateral lower abdominal pain; pain began about one week ago. Reports that when she is sitting or leaning forward, the pain is more in the upper abdomen.  Denies vaginal bleeding, discharge or LOF. Reports constipation during pregancy. Patient also reports daily headaches, unrelieved by Tylenol. Denies taking any medications today.

## 2012-11-21 ENCOUNTER — Encounter (HOSPITAL_COMMUNITY): Payer: Self-pay | Admitting: Emergency Medicine

## 2012-11-21 ENCOUNTER — Emergency Department (HOSPITAL_COMMUNITY)
Admission: EM | Admit: 2012-11-21 | Discharge: 2012-11-21 | Disposition: A | Discharge status: Home / Self Care | Attending: Emergency Medicine | Admitting: Emergency Medicine

## 2012-11-21 DIAGNOSIS — O9989 Other specified diseases and conditions complicating pregnancy, childbirth and the puerperium: Secondary | ICD-10-CM | POA: Insufficient documentation

## 2012-11-21 DIAGNOSIS — H6690 Otitis media, unspecified, unspecified ear: Secondary | ICD-10-CM | POA: Insufficient documentation

## 2012-11-21 DIAGNOSIS — O10919 Unspecified pre-existing hypertension complicating pregnancy, unspecified trimester: Secondary | ICD-10-CM | POA: Insufficient documentation

## 2012-11-21 DIAGNOSIS — H9209 Otalgia, unspecified ear: Secondary | ICD-10-CM | POA: Insufficient documentation

## 2012-11-21 DIAGNOSIS — H6692 Otitis media, unspecified, left ear: Secondary | ICD-10-CM

## 2012-11-21 DIAGNOSIS — Z8742 Personal history of other diseases of the female genital tract: Secondary | ICD-10-CM | POA: Insufficient documentation

## 2012-11-21 MED ORDER — AZITHROMYCIN 250 MG PO TABS: ORAL_TABLET | ORAL | Status: DC

## 2012-11-21 NOTE — ED Provider Notes (Signed)
History     CSN: [id number]  Arrival date & time 11/21/12  4098   First MD Initiated Contact with Patient 11/21/12 [phone]      Chief Complaint  Patient presents with  . Otalgia    (Consider location/radiation/quality/duration/timing/severity/associated sxs/prior treatment) HPI Comments: Patient comes to the ER with complaints of left ear pain. Patient reports that the pain started at around 5 AM. She reports decreased hearing, popping in the ear with increasing pain. No drainage. She says it feels like there is water in the ear when she moves her head. She has had a slight sore throat. No sinus congestion. No cough or fever.  Patient is a 27 y.o. female presenting with ear pain.  Otalgia Associated symptoms: no fever     Past Medical History  Diagnosis Date  . Renal disorder   . Hypertension   . Preeclampsia     Past Surgical History  Procedure Laterality Date  . Kidney stone surgery      History reviewed. No pertinent family history.  History  Substance Use Topics  . Smoking status: Never Smoker   . Smokeless tobacco: Not on file  . Alcohol Use: Yes    OB History   Grav Para Term Preterm Abortions TAB SAB Ect Mult Living   6 3 1 2 2  0 2 0 0 3      Review of Systems  Constitutional: Negative for fever.  HENT: Positive for ear pain.     Allergies  Review of patient's allergies indicates no known allergies.  Home Medications   Current Outpatient Rx  Name  Route  Sig  Dispense  Refill  . acetaminophen (TYLENOL) 325 MG tablet   Oral   Take 650 mg by mouth daily as needed for pain.         . prenatal vitamin w/FE, FA (NATACHEW) 29-1 MG CHEW   Oral   Chew 2 tablets by mouth every morning.           BP 129/78  Pulse 100  Temp(Src) 98.2 F (36.8 C) (Oral)  Resp 12  SpO2 100%  LMP 08/04/2012  Physical Exam  Constitutional: She is oriented to person, place, and time. She appears well-developed and well-nourished. No distress.  HENT:  Head:  Normocephalic and atraumatic.  Right Ear: Hearing normal.  Left Ear: External ear and ear canal normal. Tympanic membrane is erythematous and bulging.  Nose: Nose normal.  Mouth/Throat: Oropharynx is clear and moist and mucous membranes are normal.  Eyes: Conjunctivae and EOM are normal. Pupils are equal, round, and reactive to light.  Neck: Normal range of motion. Neck supple.  Cardiovascular: Normal rate, regular rhythm, S1 normal and S2 normal.  Exam reveals no gallop and no friction rub.   No murmur heard. Pulmonary/Chest: Effort normal and breath sounds normal. No respiratory distress. She exhibits no tenderness.  Abdominal: Soft. Normal appearance and bowel sounds are normal. There is no hepatosplenomegaly. There is no tenderness. There is no rebound, no guarding, no tenderness at McBurney's point and negative Murphy's sign. No hernia.  Musculoskeletal: Normal range of motion.  Neurological: She is alert and oriented to person, place, and time. She has normal strength. No cranial nerve deficit or sensory deficit. Coordination normal. GCS eye subscore is 4. GCS verbal subscore is 5. GCS motor subscore is 6.  Skin: Skin is warm, dry and intact. No rash noted. No cyanosis.  Psychiatric: She has a normal mood and affect. Her speech is normal and behavior  is normal. Thought content normal.    ED Course  Procedures (including critical care time)  Labs Reviewed - No data to display No results found.   Diagnosis: Otitis media    MDM  This presents with acute onset left hip pain and examination consistent with purulent otitis media. Patient to be treated with Zithromax. She is [redacted] weeks pregnant. This treatment will be safe in pregnancy. No concern for obstetric complications at this time.        Gilda Crease, MD 11/21/12 0800

## 2012-11-21 NOTE — ED Notes (Signed)
Pt from home, c/o left ear pain, states it feels like water swooshing in ear with a stabbing pain starting at 5am. Pt is [redacted] weeks pregnant. Denies injury or other illness.

## 2012-12-25 ENCOUNTER — Other Ambulatory Visit (HOSPITAL_COMMUNITY): Payer: Self-pay | Admitting: Obstetrics and Gynecology

## 2012-12-25 DIAGNOSIS — Z0489 Encounter for examination and observation for other specified reasons: Secondary | ICD-10-CM

## 2012-12-28 ENCOUNTER — Ambulatory Visit (HOSPITAL_COMMUNITY)
Admission: RE | Admit: 2012-12-28 | Discharge: 2012-12-28 | Disposition: A | Discharge status: Home / Self Care | Payer: Medicaid Other | Source: Ambulatory Visit | Attending: Obstetrics and Gynecology | Admitting: Obstetrics and Gynecology

## 2012-12-28 DIAGNOSIS — Z363 Encounter for antenatal screening for malformations: Secondary | ICD-10-CM | POA: Insufficient documentation

## 2012-12-28 DIAGNOSIS — O358XX0 Maternal care for other (suspected) fetal abnormality and damage, not applicable or unspecified: Secondary | ICD-10-CM | POA: Insufficient documentation

## 2012-12-28 DIAGNOSIS — Z0489 Encounter for examination and observation for other specified reasons: Secondary | ICD-10-CM

## 2012-12-28 DIAGNOSIS — Z1389 Encounter for screening for other disorder: Secondary | ICD-10-CM | POA: Insufficient documentation

## 2012-12-28 DIAGNOSIS — O09299 Supervision of pregnancy with other poor reproductive or obstetric history, unspecified trimester: Secondary | ICD-10-CM | POA: Insufficient documentation

## 2012-12-28 DIAGNOSIS — Z8751 Personal history of pre-term labor: Secondary | ICD-10-CM | POA: Insufficient documentation

## 2012-12-29 LAB — OB RESULTS CONSOLE HIV ANTIBODY (ROUTINE TESTING): HIV: NONREACTIVE

## 2012-12-29 LAB — OB RESULTS CONSOLE RPR: RPR: NONREACTIVE

## 2013-02-08 LAB — OB RESULTS CONSOLE ABO/RH: RH Type: NEGATIVE

## 2013-02-15 ENCOUNTER — Inpatient Hospital Stay (HOSPITAL_COMMUNITY)
Admission: AD | Admit: 2013-02-15 | Discharge: 2013-02-15 | Disposition: A | Discharge status: Home / Self Care | Payer: Medicaid Other | Source: Ambulatory Visit | Attending: Obstetrics and Gynecology | Admitting: Obstetrics and Gynecology

## 2013-02-15 ENCOUNTER — Encounter (HOSPITAL_COMMUNITY): Payer: Self-pay | Admitting: *Deleted

## 2013-02-15 DIAGNOSIS — R109 Unspecified abdominal pain: Secondary | ICD-10-CM | POA: Insufficient documentation

## 2013-02-15 DIAGNOSIS — M25519 Pain in unspecified shoulder: Secondary | ICD-10-CM | POA: Insufficient documentation

## 2013-02-15 DIAGNOSIS — O212 Late vomiting of pregnancy: Secondary | ICD-10-CM | POA: Insufficient documentation

## 2013-02-15 DIAGNOSIS — O479 False labor, unspecified: Secondary | ICD-10-CM

## 2013-02-15 DIAGNOSIS — M7918 Myalgia, other site: Secondary | ICD-10-CM

## 2013-02-15 DIAGNOSIS — O4703 False labor before 37 completed weeks of gestation, third trimester: Secondary | ICD-10-CM

## 2013-02-15 DIAGNOSIS — O219 Vomiting of pregnancy, unspecified: Secondary | ICD-10-CM

## 2013-02-15 DIAGNOSIS — O4700 False labor before 37 completed weeks of gestation, unspecified trimester: Secondary | ICD-10-CM | POA: Insufficient documentation

## 2013-02-15 LAB — CBC
HCT: 31.3 % — ABNORMAL LOW (ref 36.0–46.0)
Platelets: 218 10*3/uL (ref 150–400)
RBC: 3.82 MIL/uL — ABNORMAL LOW (ref 3.87–5.11)
RDW: 13.9 % (ref 11.5–15.5)
WBC: 8.6 10*3/uL (ref 4.0–10.5)

## 2013-02-15 LAB — URINALYSIS, ROUTINE W REFLEX MICROSCOPIC
Glucose, UA: NEGATIVE mg/dL
Protein, ur: NEGATIVE mg/dL
Specific Gravity, Urine: 1.02 (ref 1.005–1.030)
Urobilinogen, UA: 0.2 mg/dL (ref 0.0–1.0)

## 2013-02-15 LAB — URINE MICROSCOPIC-ADD ON

## 2013-02-15 MED ORDER — ONDANSETRON HCL 4 MG PO TABS: 4.0000 mg | ORAL_TABLET | Freq: Every day | ORAL | Status: DC | PRN

## 2013-02-15 MED ORDER — CYCLOBENZAPRINE HCL 10 MG PO TABS: 10.0000 mg | ORAL_TABLET | Freq: Three times a day (TID) | ORAL | Status: DC | PRN

## 2013-02-15 MED ORDER — ONDANSETRON 8 MG PO TBDP
8.0000 mg | ORAL_TABLET | Freq: Once | ORAL | Status: AC
  Administered 2013-02-15: 8 mg via ORAL
  Filled 2013-02-15: qty 1

## 2013-02-15 MED ORDER — CYCLOBENZAPRINE HCL 10 MG PO TABS
10.0000 mg | ORAL_TABLET | Freq: Once | ORAL | Status: AC
  Administered 2013-02-15: 10 mg via ORAL
  Filled 2013-02-15: qty 1

## 2013-02-15 NOTE — MAU Provider Note (Signed)
Chief Complaint:  Headache, Nausea and Abdominal Pain   First Provider Initiated Contact with Patient 02/15/13 2051     HPI: Regina Harrison is a 27 y.o. [id number] at [redacted]w[redacted]d who presents to maternity admissions reporting right flank pain, pelvic pressure, nausea bilal shoulder pain and headache since yesterday. Pain 10/10. Rare UC's, ? 1 episode of leaking fluid last night vs leaking urine. None since. Flank pain worse when lying on right side. Left shoulder pain worse when lying on left side. Has not taken anything for nausea or pain. Pain does not feeling like kidney stones. No recent injuries, or strenuous activity. Good fetal movement.   Hx 36 week delivery x 2, induced for Pre-eclampsia.   Past Medical History: Past Medical History  Diagnosis Date  . Renal disorder: Left kidney stones.    . Hypertension   . Preeclampsia     Past obstetric history:     OB History   Grav Para Term Preterm Abortions TAB SAB Ect Mult Living   6 3 1 2 2  0 2 0 0 3     # Outc Date GA Lbr Len/2nd Wgt Sex Del Anes PTL Lv   1 TRM 2004 [redacted]w[redacted]d   M SVD EPI  Yes   2 PRE 2009 109w0d   M SVD EPI Yes Yes   Comments: induced for PreE    3 SAB 2010           4 SAB 2010           5 PRE 2011 [redacted]w[redacted]d   M SVD EPI Yes Yes   Comments: induced for PreE   6 CUR               Past Surgical History: Past Surgical History  Procedure Laterality Date  . Kidney stone surgery      Family History: Family History  Problem Relation Age of Onset  . Hypertension Mother     Social History: History  Substance Use Topics  . Smoking status: Never Smoker   . Smokeless tobacco: Not on file  . Alcohol Use: Yes    Allergies: No Known Allergies  Meds:  Prescriptions prior to admission  Medication Sig Dispense Refill  . acetaminophen (TYLENOL) 500 MG tablet Take 500 mg by mouth every 6 (six) hours as needed for pain.      Marland Kitchen omeprazole (PRILOSEC) 20 MG capsule Take 20 mg by mouth daily as needed (heartburn).      .  prenatal vitamin w/FE, FA (NATACHEW) 29-1 MG CHEW Chew 2 tablets by mouth every morning.        ROS: Pertinent positive findings in history of present illness. Neg vaginal bleeding, dysuria, frequency, urgency, fever, chills, vomiting, vaginal discharge, vision changes, epigastric pain.  Physical Exam  Blood pressure 128/77, pulse 119, temperature 98.7 F (37.1 C), temperature source Oral, resp. rate 18, height 5\' 4"  (1.626 m), weight 99.791 kg (220 lb), last menstrual period 08/04/2012. GENERAL: Well-developed, well-nourished female in no acute distress.  HEENT: normocephalic HEART: normal rate RESP: normal effort ABDOMEN: Soft, non-tender, gravid appropriate for gestational age. No CVAT. EXTREMITIES: Left trapezius muscle in spasm, tender. Otherwise nontender, no edema NEURO: alert and oriented SPECULUM EXAM: NEFG, physiologic discharge, neg pooling, no blood, cervix clean.  Cervix FT ext os, closed internal os/long, -3. ?vtx.   FHT:  Baseline 150, moderate variability, accelerations present, few mild variable decelerations Contractions: rare, mild UC's and UI   Labs: Results for orders placed during the hospital  encounter of 02/15/13 (from the past 24 hour(s))  URINALYSIS, ROUTINE W REFLEX MICROSCOPIC     Status: Abnormal   Collection Time    02/15/13  7:20 PM      Result Value Range   Color, Urine YELLOW  YELLOW   APPearance CLOUDY (*) CLEAR   Specific Gravity, Urine 1.020  1.005 - 1.030   pH 6.5  5.0 - 8.0   Glucose, UA NEGATIVE  NEGATIVE mg/dL   Hgb urine dipstick TRACE (*) NEGATIVE   Bilirubin Urine NEGATIVE  NEGATIVE   Ketones, ur 15 (*) NEGATIVE mg/dL   Protein, ur NEGATIVE  NEGATIVE mg/dL   Urobilinogen, UA 0.2  0.0 - 1.0 mg/dL   Nitrite NEGATIVE  NEGATIVE   Leukocytes, UA TRACE (*) NEGATIVE  URINE MICROSCOPIC-ADD ON     Status: Abnormal   Collection Time    02/15/13  7:20 PM      Result Value Range   Squamous Epithelial / LPF RARE  RARE   WBC, UA 0-2  <3 WBC/hpf    RBC / HPF 0-2  <3 RBC/hpf   Bacteria, UA FEW (*) RARE   Urine-Other AMORPHOUS URATES/PHOSPHATES    CBC     Status: Abnormal   Collection Time    02/15/13  8:45 PM      Result Value Range   WBC 8.6  4.0 - 10.5 K/uL   RBC 3.82 (*) 3.87 - 5.11 MIL/uL   Hemoglobin 10.7 (*) 12.0 - 15.0 g/dL   HCT 16.1 (*) 09.6 - 04.5 %   MCV 81.9  78.0 - 100.0 fL   MCH 28.0  26.0 - 34.0 pg   MCHC 34.2  30.0 - 36.0 g/dL   RDW 40.9  81.1 - 91.4 %   Platelets 218  150 - 400 K/uL  FETAL FIBRONECTIN     Status: None   Collection Time    02/15/13  9:00 PM      Result Value Range   Fetal Fibronectin NEGATIVE  NEGATIVE    Imaging:  No results found.  MAU Course: Zofran and Flexeril ordered. Send fFN per Dr. Jackelyn Knife. No urine culture needed due to ned micro. Push PO fluids.   fFN neg. Pt denies UC's. Flank and shoulder pain decreased to 4/10.  Assessment: 1. Preterm contractions, third trimester   2. Musculoskeletal pain   3. Nausea/vomiting in pregnancy    Plan: Discharge home Preterm labor precautions and fetal kick counts. Comfort measures. Increase fluids and rest.  Follow-up Information   Follow up with MEISINGER,TODD D, MD On 02/18/2013.   Contact information:   [address] [place] [address] [phone]       Follow up with [facility] [place] MATERNITY ADMISSIONS. (As needed if symptoms worsen)    Contact information:   [address] [id number] [place] [address] [phone]       Medication List         acetaminophen 500 MG tablet  Commonly known as:  TYLENOL  Take 500 mg by mouth every 6 (six) hours as needed for pain.     cyclobenzaprine 10 MG tablet  Commonly known as:  FLEXERIL  Take 1 tablet (10 mg total) by mouth every 8 (eight) hours as needed for muscle spasms.     omeprazole 20 MG capsule  Commonly known as:  PRILOSEC  Take 20 mg by mouth daily as needed (heartburn).     ondansetron 4 MG tablet   Commonly  known as:  ZOFRAN  Take 1 tablet (4 mg total) by mouth daily as needed for nausea.     prenatal vitamin w/FE, FA 29-1 MG Chew  Chew 2 tablets by mouth every morning.       [place], [place] 02/15/2013 10:41 PM

## 2013-02-15 NOTE — MAU Note (Signed)
C/o R flank pain and R abdominal pain that started around 2300 last night; c/o nausea and headache also;

## 2013-02-16 NOTE — Progress Notes (Signed)
FHT from last pm reviewed.  Reassuring FHT for 29 weeks, mod variability, some variable decels, uterine irritability

## 2013-03-26 LAB — OB RESULTS CONSOLE GBS: GBS: POSITIVE

## 2013-03-31 ENCOUNTER — Inpatient Hospital Stay (HOSPITAL_COMMUNITY)
Admission: AD | Admit: 2013-03-31 | Discharge: 2013-04-01 | Disposition: A | Discharge status: Home / Self Care | Payer: Medicaid Other | Source: Ambulatory Visit | Attending: Obstetrics and Gynecology | Admitting: Obstetrics and Gynecology

## 2013-03-31 DIAGNOSIS — R109 Unspecified abdominal pain: Secondary | ICD-10-CM | POA: Insufficient documentation

## 2013-03-31 DIAGNOSIS — O4700 False labor before 37 completed weeks of gestation, unspecified trimester: Secondary | ICD-10-CM | POA: Insufficient documentation

## 2013-03-31 LAB — URINALYSIS, ROUTINE W REFLEX MICROSCOPIC
Bilirubin Urine: NEGATIVE
Glucose, UA: NEGATIVE mg/dL
Ketones, ur: NEGATIVE mg/dL
Nitrite: NEGATIVE
Protein, ur: NEGATIVE mg/dL
pH: 6.5 (ref 5.0–8.0)

## 2013-03-31 LAB — URINE MICROSCOPIC-ADD ON

## 2013-03-31 NOTE — MAU Note (Signed)
Pt G6 P3 at 35.6wks having cramping, contractions and pressure.  Denies bleeding or leaking.  No problems with pregnancy.

## 2013-03-31 NOTE — MAU Note (Addendum)
Pt states has braxton hicks lately.  States she started having regular uc's on and off since 10am. Denies leaking of fluid. Questionable loss of mucous plug yellow in color on Saturday. Discharge is presently clear creamy white

## 2013-04-01 DIAGNOSIS — O4700 False labor before 37 completed weeks of gestation, unspecified trimester: Secondary | ICD-10-CM

## 2013-04-01 NOTE — Progress Notes (Signed)
FHT from last pm reviewed.  Reactive NST, irreg ctx. 

## 2013-04-01 NOTE — MAU Provider Note (Signed)
Chief Complaint:  Contractions and Abdominal Cramping   First Provider Initiated Contact with Patient 03/31/13 2347      HPI: Regina Harrison is a 27 y.o. [id number] at 65w0dwho presents to maternity admissions reporting cramping/contractions all day today.  She reports passing her mucous plug 3 days ago.  She has a hx of preterm birth at 58 weeks in previous pregnancies.  She reports good fetal movement, denies LOF, vaginal bleeding, vaginal itching/burning, urinary symptoms, h/a, dizziness, n/v, or fever/chills.     Past Medical History: Past Medical History  Diagnosis Date  . Renal disorder   . Hypertension   . Preeclampsia     Past obstetric history: OB History  Gravida Para Term Preterm AB SAB TAB Ectopic Multiple Living  6 3 1 2 2 2  0 0 0 3    # Outcome Date GA Lbr Len/2nd Weight Sex Delivery Anes PTL Lv  6 CUR           5 PRE 2011 [redacted]w[redacted]d   M SVD EPI Y Y     Comments: induced for PreE  4 SAB 2010          3 SAB 2010          2 PRE 2009 [redacted]w[redacted]d   M SVD EPI Y Y     Comments: induced for PreE   1 TRM 2004 [redacted]w[redacted]d   M SVD EPI  Y      Past Surgical History: Past Surgical History  Procedure Laterality Date  . Kidney stone surgery      Family History: Family History  Problem Relation Age of Onset  . Hypertension Mother     Social History: History  Substance Use Topics  . Smoking status: Never Smoker   . Smokeless tobacco: Not on file  . Alcohol Use: Yes    Allergies: No Known Allergies  Meds:  Prescriptions prior to admission  Medication Sig Dispense Refill  . acetaminophen (TYLENOL) 500 MG tablet Take 500 mg by mouth every 6 (six) hours as needed for pain.      Marland Kitchen omeprazole (PRILOSEC) 20 MG capsule Take 20 mg by mouth daily as needed (heartburn).      . cyclobenzaprine (FLEXERIL) 10 MG tablet Take 1 tablet (10 mg total) by mouth every 8 (eight) hours as needed for muscle spasms.  30 tablet  1  . ondansetron (ZOFRAN) 4 MG tablet Take 1 tablet (4 mg total) by mouth  daily as needed for nausea.  30 tablet  1  . prenatal vitamin w/FE, FA (NATACHEW) 29-1 MG CHEW Chew 2 tablets by mouth every morning.        ROS: Pertinent findings in history of present illness.  Physical Exam  Blood pressure 136/89, pulse 102, temperature 98.2 F (36.8 C), temperature source Oral, resp. rate 20, height 5\' 3"  (1.6 m), weight 89.359 kg (197 lb), last menstrual period 08/04/2012. GENERAL: Well-developed, well-nourished female in no acute distress.  HEENT: normocephalic HEART: normal rate RESP: normal effort ABDOMEN: Soft, non-tender, gravid appropriate for gestational age EXTREMITIES: Nontender, no edema NEURO: alert and oriented  Cervix FT/50/-3, soft, posterior Exam by:: Sharen Counter, CNM   FHT:  Baseline 150 , moderate variability, accelerations present, no decelerations Contractions: q 3-5 mins, mild to moderate by palpation  Cervix unchanged in 1.5 hours in MAU, Ctx 5-8 minutes apart, mild to palpation   Labs: Results for orders placed during the hospital encounter of 03/31/13 (from the past 24 hour(s))  URINALYSIS, ROUTINE W  REFLEX MICROSCOPIC     Status: Abnormal   Collection Time    03/31/13 11:15 PM      Result Value Range   Color, Urine YELLOW  YELLOW   APPearance HAZY (*) CLEAR   Specific Gravity, Urine 1.025  1.005 - 1.030   pH 6.5  5.0 - 8.0   Glucose, UA NEGATIVE  NEGATIVE mg/dL   Hgb urine dipstick TRACE (*) NEGATIVE   Bilirubin Urine NEGATIVE  NEGATIVE   Ketones, ur NEGATIVE  NEGATIVE mg/dL   Protein, ur NEGATIVE  NEGATIVE mg/dL   Urobilinogen, UA 0.2  0.0 - 1.0 mg/dL   Nitrite NEGATIVE  NEGATIVE   Leukocytes, UA TRACE (*) NEGATIVE  URINE MICROSCOPIC-ADD ON     Status: Abnormal   Collection Time    03/31/13 11:15 PM      Result Value Range   Squamous Epithelial / LPF FEW (*) RARE   WBC, UA 0-2  <3 WBC/hpf   RBC / HPF 0-2  <3 RBC/hpf   Bacteria, UA MANY (*) RARE   Crystals CA OXALATE CRYSTALS (*) NEGATIVE   Urine-Other MUCOUS  PRESENT      Assessment: 1. Threatened preterm labor, antepartum, third trimester     Plan: Consult with Dr Jackelyn Knife Discharge home Labor precautions and fetal kick counts F/U as scheduled in the office Return to MAU as needed    Medication List    ASK your doctor about these medications       acetaminophen 500 MG tablet  Commonly known as:  TYLENOL  Take 500 mg by mouth every 6 (six) hours as needed for pain.     cyclobenzaprine 10 MG tablet  Commonly known as:  FLEXERIL  Take 1 tablet (10 mg total) by mouth every 8 (eight) hours as needed for muscle spasms.     omeprazole 20 MG capsule  Commonly known as:  PRILOSEC  Take 20 mg by mouth daily as needed (heartburn).     ondansetron 4 MG tablet  Commonly known as:  ZOFRAN  Take 1 tablet (4 mg total) by mouth daily as needed for nausea.     prenatal vitamin w/FE, FA 29-1 MG Chew chewable tablet  Chew 2 tablets by mouth every morning.        Sharen Counter Certified Nurse-Midwife 04/01/2013 12:18 AM

## 2013-04-06 ENCOUNTER — Encounter (HOSPITAL_COMMUNITY): Payer: Self-pay | Admitting: *Deleted

## 2013-04-06 ENCOUNTER — Inpatient Hospital Stay (HOSPITAL_COMMUNITY)
Admission: AD | Admit: 2013-04-06 | Discharge: 2013-04-06 | Disposition: A | Discharge status: Home / Self Care | Payer: Medicaid Other | Source: Ambulatory Visit | Attending: Obstetrics and Gynecology | Admitting: Obstetrics and Gynecology

## 2013-04-06 DIAGNOSIS — O4700 False labor before 37 completed weeks of gestation, unspecified trimester: Secondary | ICD-10-CM | POA: Insufficient documentation

## 2013-04-06 DIAGNOSIS — O479 False labor, unspecified: Secondary | ICD-10-CM

## 2013-04-06 NOTE — MAU Provider Note (Signed)
History     CSN: [id number]  Arrival date and time: 04/06/13 0707   First Provider Initiated Contact with Patient 04/06/13 [phone]      Chief Complaint  Patient presents with  . Labor Eval   HPI [name] is a 27 y.o. [phone] at [redacted]w[redacted]d who presents to MAU with complaint of contractions and diarrhea since last night. The patient states that the contractions woke her up around 1 am today and have persisted. She is also having intermittent cramping from the diarrhea. She states > 5 episodes of watery diarrhea last night and this morning. She has not timed the contractions, but feels that they are frequent. She also complains of increased pressure in her lower abdomen. She reports good fetal movement. Denies vaginal bleeding, discharge or LOF. She denies UTI symptoms, but was told by the office that she may have a kidney stone.    OB History   Grav Para Term Preterm Abortions TAB SAB Ect Mult Living   6 3 1 2 2  0 2 0 0 3      Past Medical History  Diagnosis Date  . Renal disorder   . Hypertension   . Preeclampsia   . Kidney stones     Past Surgical History  Procedure Laterality Date  . Kidney stone surgery      Family History  Problem Relation Age of Onset  . Hypertension Mother     History  Substance Use Topics  . Smoking status: Never Smoker   . Smokeless tobacco: Not on file  . Alcohol Use: Yes    Allergies: No Known Allergies  Prescriptions prior to admission  Medication Sig Dispense Refill  . omeprazole (PRILOSEC) 20 MG capsule Take 20 mg by mouth daily as needed (heartburn).        Review of Systems  Constitutional: Negative for fever and malaise/fatigue.  Gastrointestinal: Positive for nausea, abdominal pain and diarrhea. Negative for vomiting, constipation and blood in stool.  Genitourinary: Negative for dysuria, urgency and frequency.       No vaginal bleeding, discharge or LOF  Musculoskeletal: Positive for back pain.   Physical Exam   Blood  pressure 134/78, pulse 100, temperature 97.4 F (36.3 C), temperature source Oral, resp. rate 20, height 5' 2.5" (1.588 m), weight 197 lb 12.8 oz (89.721 kg), last menstrual period 08/04/2012.  Physical Exam  Constitutional: She is oriented to person, place, and time. She appears well-developed and well-nourished. No distress.  HENT:  Head: Normocephalic and atraumatic.  Cardiovascular: Normal rate, regular rhythm and normal heart sounds.   Respiratory: Effort normal and breath sounds normal. No respiratory distress.  GI: Bowel sounds are normal. She exhibits no distension and no mass. There is tenderness. There is no rebound and no guarding.  Neurological: She is alert and oriented to person, place, and time.  Skin: Skin is warm and dry. No erythema.  Psychiatric: She has a normal mood and affect.  Dilation: 1 Effacement (%): 50 Cervical Position: Posterior Station: -3 Presentation: Vertex Exam by:: Morrison Old RN  MAU Course  Procedures None  MDM Discussed with Dr. Ellyn Hack. Patient can have the option for discharge and monitor contractions at home or walk x 1 hour and then re-check Discussed options with the patient. She would like to walk here today and then re-check Patient walked x 1 hour. Cervix was re-checked and showed no change.   Assessment and Plan  A: Braxton Hicks Contractions  P: Discharge home Patient advised to  increase hydration and try OTC imodium PRN for diarrhea Patient advised to keep next scheduled appointment for this week with [facility] Labor precautions discussed Patient may return to MAU as needed or if her condition were to change or worsen  Freddi Starr, PA-C 04/06/2013, 8:57 AM

## 2013-04-06 NOTE — MAU Note (Signed)
C/o severe diarrhea and cramping since last night; unsure if the cramping is GI cramping or contractions;

## 2013-04-06 NOTE — MAU Note (Signed)
Patient states she is having abdominal pain but not sure if contractions. Has been having diarrhea since last night. Denies bleeding or leaking. Unsure of fetal movement.

## 2013-04-12 ENCOUNTER — Encounter (HOSPITAL_COMMUNITY): Payer: Self-pay

## 2013-04-12 ENCOUNTER — Inpatient Hospital Stay (HOSPITAL_COMMUNITY)
Admission: AD | Admit: 2013-04-12 | Discharge: 2013-04-15 | DRG: 774 | Disposition: A | Discharge status: Home / Self Care | Payer: Medicaid Other | Source: Ambulatory Visit | Attending: Obstetrics and Gynecology | Admitting: Obstetrics and Gynecology

## 2013-04-12 DIAGNOSIS — M62838 Other muscle spasm: Secondary | ICD-10-CM | POA: Diagnosis not present

## 2013-04-12 DIAGNOSIS — O1493 Unspecified pre-eclampsia, third trimester: Secondary | ICD-10-CM

## 2013-04-12 DIAGNOSIS — O99892 Other specified diseases and conditions complicating childbirth: Secondary | ICD-10-CM | POA: Diagnosis present

## 2013-04-12 DIAGNOSIS — Z22330 Carrier of Group B streptococcus: Secondary | ICD-10-CM

## 2013-04-12 DIAGNOSIS — IMO0002 Reserved for concepts with insufficient information to code with codable children: Principal | ICD-10-CM | POA: Diagnosis present

## 2013-04-12 DIAGNOSIS — O1400 Mild to moderate pre-eclampsia, unspecified trimester: Secondary | ICD-10-CM | POA: Diagnosis present

## 2013-04-12 DIAGNOSIS — M542 Cervicalgia: Secondary | ICD-10-CM | POA: Diagnosis not present

## 2013-04-12 LAB — COMPREHENSIVE METABOLIC PANEL
AST: 18 U/L (ref 0–37)
Albumin: 2.7 g/dL — ABNORMAL LOW (ref 3.5–5.2)
Alkaline Phosphatase: 174 U/L — ABNORMAL HIGH (ref 39–117)
BUN: 11 mg/dL (ref 6–23)
Chloride: 102 mEq/L (ref 96–112)
Potassium: 3.9 mEq/L (ref 3.5–5.1)
Total Bilirubin: 0.2 mg/dL — ABNORMAL LOW (ref 0.3–1.2)

## 2013-04-12 LAB — CBC
HCT: 30.4 % — ABNORMAL LOW (ref 36.0–46.0)
HCT: 33 % — ABNORMAL LOW (ref 36.0–46.0)
Hemoglobin: 10.8 g/dL — ABNORMAL LOW (ref 12.0–15.0)
MCH: 24.8 pg — ABNORMAL LOW (ref 26.0–34.0)
MCHC: 32.7 g/dL (ref 30.0–36.0)
RBC: 4 MIL/uL (ref 3.87–5.11)
RDW: 15.1 % (ref 11.5–15.5)
WBC: 10 10*3/uL (ref 4.0–10.5)

## 2013-04-12 LAB — URINE MICROSCOPIC-ADD ON

## 2013-04-12 LAB — URINALYSIS, ROUTINE W REFLEX MICROSCOPIC
Bilirubin Urine: NEGATIVE
Specific Gravity, Urine: >1.03 — ABNORMAL HIGH (ref 1.005–1.030)
pH: 6 (ref 5.0–8.0)

## 2013-04-12 LAB — LACTATE DEHYDROGENASE: LDH: 235 U/L (ref 94–250)

## 2013-04-12 MED ORDER — FENTANYL 2.5 MCG/ML BUPIVACAINE 1/10 % EPIDURAL INFUSION (WH - ANES)
14.0000 mL/h | INTRAMUSCULAR | Status: DC | PRN
  Administered 2013-04-13 (×2): 14 mL/h via EPIDURAL
  Filled 2013-04-12 (×2): qty 125

## 2013-04-12 MED ORDER — LACTATED RINGERS IV SOLN
500.0000 mL | INTRAVENOUS | Status: DC | PRN
  Administered 2013-04-13: 500 mL via INTRAVENOUS

## 2013-04-12 MED ORDER — TERBUTALINE SULFATE 1 MG/ML IJ SOLN: 0.2500 mg | Freq: Once | INTRAMUSCULAR | Status: AC | PRN

## 2013-04-12 MED ORDER — EPHEDRINE 5 MG/ML INJ
10.0000 mg | INTRAVENOUS | Status: DC | PRN
  Filled 2013-04-12: qty 2

## 2013-04-12 MED ORDER — OXYCODONE-ACETAMINOPHEN 5-325 MG PO TABS
1.0000 | ORAL_TABLET | ORAL | Status: DC | PRN
  Administered 2013-04-13: 1 via ORAL
  Filled 2013-04-12: qty 1

## 2013-04-12 MED ORDER — DEXTROSE 5 % IV SOLN
5.0000 10*6.[IU] | Freq: Once | INTRAVENOUS | Status: AC
  Administered 2013-04-12: 5 10*6.[IU] via INTRAVENOUS
  Filled 2013-04-12: qty 5

## 2013-04-12 MED ORDER — PENICILLIN G POTASSIUM 5000000 UNITS IJ SOLR
2.5000 10*6.[IU] | INTRAVENOUS | Status: DC
  Administered 2013-04-13 (×3): 2.5 10*6.[IU] via INTRAVENOUS
  Filled 2013-04-12 (×7): qty 2.5

## 2013-04-12 MED ORDER — PHENYLEPHRINE 40 MCG/ML (10ML) SYRINGE FOR IV PUSH (FOR BLOOD PRESSURE SUPPORT)
80.0000 ug | PREFILLED_SYRINGE | INTRAVENOUS | Status: DC | PRN
  Filled 2013-04-12: qty 2

## 2013-04-12 MED ORDER — DIPHENHYDRAMINE HCL 50 MG/ML IJ SOLN
12.5000 mg | INTRAMUSCULAR | Status: DC | PRN
  Administered 2013-04-13: 12.5 mg via INTRAVENOUS
  Filled 2013-04-12: qty 1

## 2013-04-12 MED ORDER — FLEET ENEMA 7-19 GM/118ML RE ENEM: 1.0000 | ENEMA | RECTAL | Status: DC | PRN

## 2013-04-12 MED ORDER — OXYTOCIN 40 UNITS IN LACTATED RINGERS INFUSION - SIMPLE MED
1.0000 m[IU]/min | INTRAVENOUS | Status: DC
  Administered 2013-04-12: 2 m[IU]/min via INTRAVENOUS
  Filled 2013-04-12: qty 1000

## 2013-04-12 MED ORDER — EPHEDRINE 5 MG/ML INJ
10.0000 mg | INTRAVENOUS | Status: DC | PRN
  Filled 2013-04-12: qty 2
  Filled 2013-04-12: qty 4

## 2013-04-12 MED ORDER — LACTATED RINGERS IV SOLN: 500.0000 mL | Freq: Once | INTRAVENOUS | Status: DC

## 2013-04-12 MED ORDER — OXYTOCIN 40 UNITS IN LACTATED RINGERS INFUSION - SIMPLE MED
62.5000 mL/h | INTRAVENOUS | Status: DC
  Administered 2013-04-13: 62.5 mL/h via INTRAVENOUS

## 2013-04-12 MED ORDER — ONDANSETRON HCL 4 MG/2ML IJ SOLN
4.0000 mg | Freq: Four times a day (QID) | INTRAMUSCULAR | Status: DC | PRN
  Administered 2013-04-13: 4 mg via INTRAVENOUS
  Filled 2013-04-12: qty 2

## 2013-04-12 MED ORDER — OXYTOCIN BOLUS FROM INFUSION: 500.0000 mL | INTRAVENOUS | Status: DC

## 2013-04-12 MED ORDER — PHENYLEPHRINE 40 MCG/ML (10ML) SYRINGE FOR IV PUSH (FOR BLOOD PRESSURE SUPPORT)
80.0000 ug | PREFILLED_SYRINGE | INTRAVENOUS | Status: DC | PRN
  Filled 2013-04-12: qty 5
  Filled 2013-04-12: qty 2

## 2013-04-12 MED ORDER — CITRIC ACID-SODIUM CITRATE 334-500 MG/5ML PO SOLN: 30.0000 mL | ORAL | Status: DC | PRN

## 2013-04-12 MED ORDER — ACETAMINOPHEN 325 MG PO TABS
650.0000 mg | ORAL_TABLET | ORAL | Status: DC | PRN
  Administered 2013-04-12: 650 mg via ORAL
  Filled 2013-04-12: qty 2

## 2013-04-12 MED ORDER — IBUPROFEN 600 MG PO TABS
600.0000 mg | ORAL_TABLET | Freq: Four times a day (QID) | ORAL | Status: DC | PRN
  Administered 2013-04-13: 600 mg via ORAL
  Filled 2013-04-12: qty 1

## 2013-04-12 MED ORDER — LIDOCAINE HCL (PF) 1 % IJ SOLN
30.0000 mL | INTRAMUSCULAR | Status: DC | PRN
  Filled 2013-04-12 (×2): qty 30

## 2013-04-12 MED ORDER — BUTORPHANOL TARTRATE 1 MG/ML IJ SOLN: 1.0000 mg | INTRAMUSCULAR | Status: DC | PRN

## 2013-04-12 MED ORDER — LACTATED RINGERS IV SOLN
INTRAVENOUS | Status: DC
  Administered 2013-04-12: 125 mL/h via INTRAVENOUS
  Administered 2013-04-13 (×2): via INTRAVENOUS

## 2013-04-12 NOTE — MAU Note (Signed)
Headache started last night, took some tylenol- dulled it.  Feet are swelling.  Contractions- back pain and sharp pains in abd.

## 2013-04-12 NOTE — H&P (Signed)
Regina Harrison is a 27 y.o. female [phone] at 37+ with proteinuria, elevated BP and uric acid - likely PreE for augmentation of labor.  History of PIH/PreE x 2.  Pt with late entry to [facility].  Also with GBBS +.  +FM, no LOF, no VB, ctx increasing in intensity and frequency.     Maternal Medical History:  Reason for admission: Contractions.   Contractions: Frequency: regular.   Perceived severity is moderate.    Fetal activity: Perceived fetal activity is normal.    Prenatal complications: PIH and pre-eclampsia.   Prenatal Complications - Diabetes: none.    OB History   Grav Para Term Preterm Abortions TAB SAB Ect Mult Living   6 3 1 2 2  0 2 0 0 3    G1 SVD 7#1 G2 35wk SVD PreE G3 SAB G4 SAB, cytotec G5 7#15, PIH, SVD 7#15 36 wk G6 present  No abn pap H/o Chl  Past Medical History  Diagnosis Date  . Renal disorder   . Hypertension   . Preeclampsia   . Kidney stones   . Preterm labor   . Pregnancy induced hypertension    Past Surgical History  Procedure Laterality Date  . Kidney stone surgery     Family History: family history includes Hypertension in her mother. Social History:  reports that she has never smoked. She does not have any smokeless tobacco history on file. She reports that  drinks alcohol. She reports that she does not use illicit drugs. All NKDA  Prenatal Transfer Tool  Maternal Diabetes: No Genetic Screening: Declined, late to care Maternal Ultrasounds/Referrals: Normal Fetal Ultrasounds or other Referrals:  None Maternal Substance Abuse:  No Significant Maternal Medications:  None Significant Maternal Lab Results:  Lab values include: Group B Strep positive Other Comments:  late entry to care at 22wk, glucola 140 - nl 3hr GTT  Review of Systems  Constitutional: Negative.   HENT: Negative.   Eyes: Negative.   Respiratory: Negative.   Cardiovascular: Negative.   Gastrointestinal: Negative.   Genitourinary: Negative.   Musculoskeletal:  Negative.   Skin: Negative.   Neurological: Negative.   Psychiatric/Behavioral: Negative.     Dilation: 1 Effacement (%): 50 Station: -3 Exam by:: Dr. Ellyn Hack Blood pressure 136/77, pulse 91, temperature 98.5 F (36.9 C), temperature source Oral, resp. rate 18, height 5\' 3"  (1.6 m), weight 90.266 kg (199 lb), last menstrual period 08/04/2012, SpO2 98.00%. Maternal Exam:  Uterine Assessment: Contraction strength is moderate.  Contraction frequency is irregular.   Abdomen: Fundal height is appropriate for gestation.   Estimated fetal weight is 7-8#.   Fetal presentation: vertex  Introitus: Normal vulva. Normal vagina.  Pelvis: adequate for delivery.   Cervix: Cervix evaluated by digital exam.     Physical Exam  Constitutional: She is oriented to person, place, and time. She appears well-developed and well-nourished.  HENT:  Head: Normocephalic and atraumatic.  Cardiovascular: Normal rate and regular rhythm.   Respiratory: Effort normal and breath sounds normal. No respiratory distress.  GI: Soft. Bowel sounds are normal. There is no tenderness.  Musculoskeletal: Normal range of motion.  Neurological: She is alert and oriented to person, place, and time.  Skin: Skin is warm and dry.  Psychiatric: She has a normal mood and affect. Her behavior is normal.    Prenatal labs: ABO, Rh: A/Neg/-- (06/30 0000) Antibody: Negative (06/30 0000) Rubella: Immune (05/20 0000) RPR: Nonreactive (05/20 0000)  HBsAg:    HIV: Non-reactive (05/20 0000)  GBS:  Positive (08/15 0000)  Hgb 12.2/Pap WNL/Chl neg/ GC neg/ glucola 140 - nl 3hr GTT/209K plt  [place] 22wk female, limited nl anat [place] 26_5 good growth, nl anat, anat plac, female  Assessment/Plan: 19JY [id number] at 79 with PreE for iol Closely monitor BP, t/c Magnesium IV pain meds and epidural PRN Induce with pitocin Expect SVD ROM 4 hr after PCN if possible HepBsAg - not obtained in prenatal care - will get in L&D   BOVARD,Serena Petterson 04/12/2013,  10:17 PM

## 2013-04-12 NOTE — MAU Note (Signed)
Pt states having sharp pain in vaginal area, baby moving frequently, irregular contractions, denies bleeding or vaginal d/c changes besides mucus plug

## 2013-04-12 NOTE — MAU Provider Note (Signed)
History     CSN: [id number]  Arrival date and time: 04/12/13 1604   First Provider Initiated Contact with Patient 04/12/13 1727      Chief Complaint  Patient presents with  . Labor Eval   HPI [name] is a 27 y.o. [id number] at [redacted]w[redacted]d who presents to MAU today with complaint of abdominal pain and headache. The patient states that she has diffuse abdominal pain and back pain similar to last MAU visit, but worse. She states new onset of low back pain that comes and goes. She is having occasional contractions and a mucous discharge. She denies vaginal bleeding or LOF. She has headache rated at 5/10 now. Tylenol did not relief pain for long earlier today. She has a history of pre-eclampsia with 2 of her previous pregnancies.   OB History   Grav Para Term Preterm Abortions TAB SAB Ect Mult Living   6 3 1 2 2  0 2 0 0 3      Past Medical History  Diagnosis Date  . Renal disorder   . Hypertension   . Preeclampsia   . Kidney stones   . Preterm labor   . Pregnancy induced hypertension     Past Surgical History  Procedure Laterality Date  . Kidney stone surgery      Family History  Problem Relation Age of Onset  . Hypertension Mother     History  Substance Use Topics  . Smoking status: Never Smoker   . Smokeless tobacco: Not on file  . Alcohol Use: Yes    Allergies: No Known Allergies  Prescriptions prior to admission  Medication Sig Dispense Refill  . acetaminophen (TYLENOL) 325 MG tablet Take 650 mg by mouth every 6 (six) hours as needed for pain (For headache.).      Marland Kitchen omeprazole (PRILOSEC) 20 MG capsule Take 20 mg by mouth daily as needed (heartburn).        Review of Systems  Constitutional: Negative for fever.  Gastrointestinal: Positive for abdominal pain.  Genitourinary:       Neg - vaginal bleeding, LOF + vaginal discharge  Musculoskeletal: Positive for back pain.   Physical Exam   Blood pressure 125/84, pulse 99, temperature 98.3 F (36.8 C),  temperature source Oral, resp. rate 20, height 5\' 3"  (1.6 m), weight 199 lb (90.266 kg), last menstrual period 08/04/2012, SpO2 98.00%.  Physical Exam  Constitutional: She is oriented to person, place, and time. She appears well-developed and well-nourished. No distress.  HENT:  Head: Normocephalic and atraumatic.  Cardiovascular: Normal rate, regular rhythm and normal heart sounds.   Respiratory: Effort normal and breath sounds normal. No respiratory distress.  GI: Soft. Bowel sounds are normal. She exhibits no distension and no mass. There is tenderness (mild diffuse tenderness to palpation of the abdomen, no noteable RUQ tenderness). There is no rebound and no guarding.  Musculoskeletal: She exhibits no edema and no tenderness.  Neurological: She is alert and oriented to person, place, and time. She has normal reflexes.  No clonus  Skin: Skin is warm and dry. No erythema.  Psychiatric: She has a normal mood and affect.  Dilation: 1 Effacement (%): 50 Cervical Position: Posterior Station: -3 Exam by:: SBeck, RN  Results for orders placed during the hospital encounter of 04/12/13 (from the past 24 hour(s))  URINALYSIS, ROUTINE W REFLEX MICROSCOPIC     Status: Abnormal   Collection Time    04/12/13  4:25 PM      Result Value  Range   Color, Urine YELLOW  YELLOW   APPearance CLEAR  CLEAR   Specific Gravity, Urine >1.030 (*) 1.005 - 1.030   pH 6.0  5.0 - 8.0   Glucose, UA NEGATIVE  NEGATIVE mg/dL   Hgb urine dipstick TRACE (*) NEGATIVE   Bilirubin Urine NEGATIVE  NEGATIVE   Ketones, ur NEGATIVE  NEGATIVE mg/dL   Protein, ur 161 (*) NEGATIVE mg/dL   Urobilinogen, UA 0.2  0.0 - 1.0 mg/dL   Nitrite NEGATIVE  NEGATIVE   Leukocytes, UA NEGATIVE  NEGATIVE  URINE MICROSCOPIC-ADD ON     Status: Abnormal   Collection Time    04/12/13  4:25 PM      Result Value Range   Squamous Epithelial / LPF MANY (*) RARE   WBC, UA 3-6  <3 WBC/hpf   Bacteria, UA MANY (*) RARE   Urine-Other MUCOUS  PRESENT    CBC     Status: Abnormal   Collection Time    04/12/13  6:05 PM      Result Value Range   WBC 10.0  4.0 - 10.5 K/uL   RBC 4.00  3.87 - 5.11 MIL/uL   Hemoglobin 10.0 (*) 12.0 - 15.0 g/dL   HCT 09.6 (*) 04.5 - 40.9 %   MCV 76.0 (*) 78.0 - 100.0 fL   MCH 25.0 (*) 26.0 - 34.0 pg   MCHC 32.9  30.0 - 36.0 g/dL   RDW 81.1  91.4 - 78.2 %   Platelets 236  150 - 400 K/uL  COMPREHENSIVE METABOLIC PANEL     Status: Abnormal   Collection Time    04/12/13  6:05 PM      Result Value Range   Sodium 135  135 - 145 mEq/L   Potassium 3.9  3.5 - 5.1 mEq/L   Chloride 102  96 - 112 mEq/L   CO2 20  19 - 32 mEq/L   Glucose, Bld 83  70 - 99 mg/dL   BUN 11  6 - 23 mg/dL   Creatinine, Ser 9.56  0.50 - 1.10 mg/dL   Calcium 8.9  8.4 - 21.3 mg/dL   Total Protein 6.2  6.0 - 8.3 g/dL   Albumin 2.7 (*) 3.5 - 5.2 g/dL   AST 18  0 - 37 U/L   ALT 10  0 - 35 U/L   Alkaline Phosphatase 174 (*) 39 - 117 U/L   Total Bilirubin 0.2 (*) 0.3 - 1.2 mg/dL   GFR calc non Af Amer >90  >90 mL/min   GFR calc Af Amer >90  >90 mL/min  LACTATE DEHYDROGENASE     Status: None   Collection Time    04/12/13  6:05 PM      Result Value Range   LDH 235  94 - 250 U/L  URIC ACID     Status: Abnormal   Collection Time    04/12/13  6:05 PM      Result Value Range   Uric Acid, Serum 7.2 (*) 2.4 - 7.0 mg/dL   Fetal Monitoring: Baseline: 150 bpm, moderate variability, + accelerations, no decelerations Contractions: q 6-8 minutes, moderately palpable  MAU Course  Procedures None  MDM Discussed with Dr. Ellyn Hack. Order CBC, CMP, Uric acid, LDH and cath UA today Patient refused cath UA Discussed patient's results with Dr. Ellyn Hack. Admit for induction of labor for pre-eclampsia. Orders entered in epic.  Assessment and Plan  A: Pre-eclampsia  P: Admit to L&D for induction of labor  Raynelle Fanning  Donzetta Starch, PA-C  04/12/2013, 7:51 PM

## 2013-04-13 ENCOUNTER — Inpatient Hospital Stay (HOSPITAL_COMMUNITY): Payer: Medicaid Other | Admitting: Anesthesiology

## 2013-04-13 ENCOUNTER — Encounter (HOSPITAL_COMMUNITY): Payer: Self-pay

## 2013-04-13 ENCOUNTER — Encounter (HOSPITAL_COMMUNITY): Payer: Self-pay | Admitting: Anesthesiology

## 2013-04-13 LAB — CBC
Hemoglobin: 9.6 g/dL — ABNORMAL LOW (ref 12.0–15.0)
MCHC: 32.8 g/dL (ref 30.0–36.0)
RDW: 15.5 % (ref 11.5–15.5)
WBC: 12 10*3/uL — ABNORMAL HIGH (ref 4.0–10.5)

## 2013-04-13 LAB — URINE CULTURE: Culture: NO GROWTH

## 2013-04-13 LAB — HEPATITIS B SURFACE ANTIGEN: Hepatitis B Surface Ag: NEGATIVE

## 2013-04-13 MED ORDER — OXYCODONE-ACETAMINOPHEN 5-325 MG PO TABS
1.0000 | ORAL_TABLET | ORAL | Status: DC | PRN
  Administered 2013-04-13: 2 via ORAL
  Administered 2013-04-13 – 2013-04-14 (×3): 1 via ORAL
  Administered 2013-04-14 – 2013-04-15 (×2): 2 via ORAL
  Filled 2013-04-13 (×2): qty 1
  Filled 2013-04-13 (×2): qty 2
  Filled 2013-04-13: qty 1
  Filled 2013-04-13: qty 2

## 2013-04-13 MED ORDER — LIDOCAINE HCL (PF) 1 % IJ SOLN
INTRAMUSCULAR | Status: DC | PRN
  Administered 2013-04-13 (×4): 4 mL

## 2013-04-13 MED ORDER — LANOLIN HYDROUS EX OINT: TOPICAL_OINTMENT | CUTANEOUS | Status: DC | PRN

## 2013-04-13 MED ORDER — MEASLES, MUMPS & RUBELLA VAC ~~LOC~~ INJ
0.5000 mL | INJECTION | Freq: Once | SUBCUTANEOUS | Status: DC
  Filled 2013-04-13: qty 0.5

## 2013-04-13 MED ORDER — TETANUS-DIPHTH-ACELL PERTUSSIS 5-2.5-18.5 LF-MCG/0.5 IM SUSP
0.5000 mL | Freq: Once | INTRAMUSCULAR | Status: AC
  Administered 2013-04-15: 0.5 mL via INTRAMUSCULAR
  Filled 2013-04-13: qty 0.5

## 2013-04-13 MED ORDER — ZOLPIDEM TARTRATE 5 MG PO TABS: 5.0000 mg | ORAL_TABLET | Freq: Every evening | ORAL | Status: DC | PRN

## 2013-04-13 MED ORDER — DIPHENHYDRAMINE HCL 25 MG PO CAPS: 25.0000 mg | ORAL_CAPSULE | Freq: Four times a day (QID) | ORAL | Status: DC | PRN

## 2013-04-13 MED ORDER — WITCH HAZEL-GLYCERIN EX PADS: 1.0000 "application " | MEDICATED_PAD | CUTANEOUS | Status: DC | PRN

## 2013-04-13 MED ORDER — PRENATAL MULTIVITAMIN CH
1.0000 | ORAL_TABLET | Freq: Every day | ORAL | Status: DC
  Administered 2013-04-14: 1 via ORAL
  Filled 2013-04-13: qty 1

## 2013-04-13 MED ORDER — IBUPROFEN 600 MG PO TABS
600.0000 mg | ORAL_TABLET | Freq: Four times a day (QID) | ORAL | Status: DC
  Administered 2013-04-13 – 2013-04-15 (×7): 600 mg via ORAL
  Filled 2013-04-13 (×7): qty 1

## 2013-04-13 MED ORDER — ONDANSETRON HCL 4 MG PO TABS: 4.0000 mg | ORAL_TABLET | ORAL | Status: DC | PRN

## 2013-04-13 MED ORDER — DIBUCAINE 1 % RE OINT: 1.0000 "application " | TOPICAL_OINTMENT | RECTAL | Status: DC | PRN

## 2013-04-13 MED ORDER — METHYLERGONOVINE MALEATE 0.2 MG/ML IJ SOLN: 0.2000 mg | INTRAMUSCULAR | Status: DC | PRN

## 2013-04-13 MED ORDER — METHYLERGONOVINE MALEATE 0.2 MG PO TABS
0.2000 mg | ORAL_TABLET | ORAL | Status: DC | PRN
  Filled 2013-04-13: qty 1

## 2013-04-13 MED ORDER — CYCLOBENZAPRINE HCL 10 MG PO TABS
10.0000 mg | ORAL_TABLET | Freq: Three times a day (TID) | ORAL | Status: DC | PRN
  Administered 2013-04-13 – 2013-04-15 (×4): 10 mg via ORAL
  Filled 2013-04-13: qty 1

## 2013-04-13 MED ORDER — SIMETHICONE 80 MG PO CHEW: 80.0000 mg | CHEWABLE_TABLET | ORAL | Status: DC | PRN

## 2013-04-13 MED ORDER — ONDANSETRON HCL 4 MG/2ML IJ SOLN: 4.0000 mg | INTRAMUSCULAR | Status: DC | PRN

## 2013-04-13 MED ORDER — SENNOSIDES-DOCUSATE SODIUM 8.6-50 MG PO TABS
2.0000 | ORAL_TABLET | Freq: Every day | ORAL | Status: DC
  Administered 2013-04-13: 2 via ORAL

## 2013-04-13 MED ORDER — BENZOCAINE-MENTHOL 20-0.5 % EX AERO
1.0000 "application " | INHALATION_SPRAY | CUTANEOUS | Status: DC | PRN
  Administered 2013-04-13: 1 via TOPICAL
  Filled 2013-04-13: qty 56

## 2013-04-13 MED ORDER — MAGNESIUM HYDROXIDE 400 MG/5ML PO SUSP: 30.0000 mL | ORAL | Status: DC | PRN

## 2013-04-13 NOTE — Anesthesia Preprocedure Evaluation (Signed)
Anesthesia Evaluation  Patient identified by MRN, date of birth, ID band Patient awake    Reviewed: Allergy & Precautions, H&P , NPO status , Patient's Chart, lab work & pertinent test results, reviewed documented beta blocker date and time   History of Anesthesia Complications Negative for: history of anesthetic complications  Airway Mallampati: II TM Distance: >3 FB Neck ROM: full    Dental  (+) Teeth Intact Braces on most teeth:   Pulmonary neg pulmonary ROS,  breath sounds clear to auscultation        Cardiovascular hypertension (PIH), Rhythm:regular Rate:Normal     Neuro/Psych negative neurological ROS  negative psych ROS   GI/Hepatic negative GI ROS, Neg liver ROS,   Endo/Other  Morbid obesity  Renal/GU Renal disease (h/o kidney stones)     Musculoskeletal   Abdominal   Peds  Hematology  (+) anemia ,   Anesthesia Other Findings   Reproductive/Obstetrics (+) Pregnancy                           Anesthesia Physical Anesthesia Plan  ASA: III  Anesthesia Plan: Epidural   Post-op Pain Management:    Induction:   Airway Management Planned:   Additional Equipment:   Intra-op Plan:   Post-operative Plan:   Informed Consent: I have reviewed the patients History and Physical, chart, labs and discussed the procedure including the risks, benefits and alternatives for the proposed anesthesia with the patient or authorized representative who has indicated his/her understanding and acceptance.     Plan Discussed with:   Anesthesia Plan Comments:         Anesthesia Quick Evaluation

## 2013-04-13 NOTE — Anesthesia Postprocedure Evaluation (Signed)
  Anesthesia Post-op Note  Patient: Regina Harrison  Procedure(s) Performed: * No procedures listed *  Patient Location: Mother/Baby  Anesthesia Type:Epidural  Level of Consciousness: awake  Airway and Oxygen Therapy: Patient Spontanous Breathing  Post-op Pain: mild  Post-op Assessment: Patient's Cardiovascular Status Stable and Respiratory Function Stable  Post-op Vital Signs: stable  Complications: No apparent anesthesia complications

## 2013-04-13 NOTE — Progress Notes (Signed)
Comfortable with epidural Afeb, VSS, BP normal FHT- Cat I, ctx q 2-3 min VE-3-4/50/-2, vtx, AROM clear Will continue to monitor BP, no signs or symptoms of severe preeclampsia that would require magnesium, continue pitocin and monitor progress, continue PCN for +GBS

## 2013-04-13 NOTE — Anesthesia Procedure Notes (Signed)
Epidural Patient location during procedure: OB Start time: 04/13/2013 2:54 AM  Staffing Performed by: anesthesiologist   Preanesthetic Checklist Completed: patient identified, site marked, surgical consent, pre-op evaluation, timeout performed, IV checked, risks and benefits discussed and monitors and equipment checked  Epidural Patient position: sitting Prep: site prepped and draped and DuraPrep Patient monitoring: continuous pulse ox and blood pressure Approach: midline Injection technique: LOR air  Needle:  Needle type: Tuohy  Needle gauge: 17 G Needle length: 9 cm and 9 Needle insertion depth: 8 cm Catheter type: closed end flexible Catheter size: 19 Gauge Catheter at skin depth: 13 cm Test dose: negative  Assessment Events: blood not aspirated, injection not painful, no injection resistance, negative IV test and no paresthesia  Additional Notes Discussed risk of headache, infection, bleeding, nerve injury and failed or incomplete block.  Patient voices understanding and wishes to proceed.  Epidural placed with some difficulty due to patient positioning, movement and cooperation during procedure.  Eventually redirected verbally and epidural placed easily.  No paresthesia.  Patient tolerated procedure well with no apparent complications.  Jasmine December, MD Reason for block:procedure for pain

## 2013-04-13 NOTE — Progress Notes (Signed)
Delivery of live viable female by Dr Jackelyn Knife. APGARs 8,9

## 2013-04-14 DIAGNOSIS — O1400 Mild to moderate pre-eclampsia, unspecified trimester: Secondary | ICD-10-CM | POA: Diagnosis present

## 2013-04-14 MED ORDER — RHO D IMMUNE GLOBULIN 1500 UNIT/2ML IJ SOLN
300.0000 ug | Freq: Once | INTRAMUSCULAR | Status: AC
  Administered 2013-04-14: 300 ug via INTRAMUSCULAR
  Filled 2013-04-14: qty 2

## 2013-04-14 NOTE — Progress Notes (Signed)
PPD #1 Still having neck pain, difficulty turning neck, may be a little worse when standing-Flexeril seems to help Afeb, VSS, BP normal Fundus firm, NT at U-1 Continue routine postpartum care, will have anesthesia evaluate for possible spinal headache-but I suspect this is muscle spasm

## 2013-04-14 NOTE — Discharge Summary (Signed)
Obstetric Discharge Summary Reason for Admission: induction of labor Prenatal Procedures: Preeclampsia (mild) Intrapartum Procedures: spontaneous vaginal delivery Postpartum Procedures: none Complications-Operative and Postpartum: neck pain from muscle spasm Hemoglobin  Date Value Range Status  04/13/2013 9.6* 12.0 - 15.0 g/dL Final     HCT  Date Value Range Status  04/13/2013 29.3* 36.0 - 46.0 % Final    Physical Exam:  General: alert and cooperative Lochia: appropriate Uterine Fundus: firm   Discharge Diagnoses: Term Pregnancy-delivered  Discharge Information: Date: 04/15/2013 Activity: pelvic rest Diet: routine Medications: Ibuprofen Condition: improved Instructions: refer to practice specific booklet Discharge to: home Follow-up Information   Follow up with MEISINGER,TODD D, MD In 6 weeks. (postpartum)    Specialty:  Obstetrics and Gynecology   Contact information:   [address] [place] [address] [phone]       Newborn Data: Live born female  Birth Weight: 8 lb 13 oz (3997 g) APGAR: 8, 9  Home with mother.  Carolanne Mercier W 04/15/2013, 8:30 AM

## 2013-04-14 NOTE — Progress Notes (Signed)
Asked to see patient regarding neck pain. Patient had continuous lumbar epidural for labor and vaginal delivery yesterday. She pushed for approximately with her neck hyperflexed. When she was pushing she told the nurse that flexing her neck was quite uncomfortable. Yesterday after delivery she noted neck pain which radiated to the base of her skull which was exacerbated by movement, especially rotation and lateral flexion. Her discomfort was improved by having her significant other massage her neck. She denies HA, neurological symptoms, photophobia, tinnitus or N/V. I think this is unrelated to her epidural and she most likely has a neck strain related to postioning during the second stage of labor. Recommend continuing muscle relaxants and analgesics. Patient reassured. This should resolve rather quickly.

## 2013-04-14 NOTE — Progress Notes (Signed)
Ur chart review completed.  

## 2013-04-15 LAB — TYPE AND SCREEN
ABO/RH(D): A NEG
Antibody Screen: POSITIVE
Unit division: 0

## 2013-04-15 LAB — RH IG WORKUP (INCLUDES ABO/RH)
ABO/RH(D): A NEG
Fetal Screen: NEGATIVE
Gestational Age(Wks): 37.5

## 2013-04-15 MED ORDER — OXYCODONE-ACETAMINOPHEN 5-325 MG PO TABS: 1.0000 | ORAL_TABLET | ORAL | Status: DC | PRN

## 2013-04-15 MED ORDER — CYCLOBENZAPRINE HCL 10 MG PO TABS: 10.0000 mg | ORAL_TABLET | Freq: Three times a day (TID) | ORAL | Status: DC | PRN

## 2013-04-15 MED ORDER — IBUPROFEN 600 MG PO TABS: 600.0000 mg | ORAL_TABLET | Freq: Four times a day (QID) | ORAL | Status: DC

## 2013-04-15 NOTE — Progress Notes (Signed)
Post Partum Day 2 Subjective: Pt still have neck spasm but flexeril helping, anesthesia cleared and agreed muscular, no other issues  Objective: Blood pressure 119/78, pulse 88, temperature 98.2 F (36.8 C), temperature source Oral, resp. rate 18, height 5\' 3"  (1.6 m), weight 90.266 kg (199 lb), last menstrual period 08/04/2012, SpO2 98.00%, unknown if currently breastfeeding.  Physical Exam:  General: alert and cooperative Lochia: appropriate Uterine Fundus: firm    Recent Labs  04/12/13 1930 04/13/13 0218  HGB 10.8* 9.6*  HCT 33.0* 29.3*    Assessment/Plan: BP normal Will d/c with flexeril for the neck pain   LOS: 3 days   Regina Harrison W 04/15/2013, 8:27 AM

## 2013-04-15 NOTE — Progress Notes (Signed)
Went in sever times through out the night and FOB was sleeping on the couch with the baby. I reminded him that he needed to baby in the bassinet when he was feeling sleepy.

## 2013-04-19 ENCOUNTER — Inpatient Hospital Stay (HOSPITAL_COMMUNITY)
Admission: AD | Admit: 2013-04-19 | Discharge: 2013-04-19 | Disposition: A | Discharge status: Home / Self Care | Payer: Medicaid Other | Source: Ambulatory Visit | Attending: Obstetrics and Gynecology | Admitting: Obstetrics and Gynecology

## 2013-04-19 DIAGNOSIS — R030 Elevated blood-pressure reading, without diagnosis of hypertension: Secondary | ICD-10-CM | POA: Insufficient documentation

## 2013-04-19 DIAGNOSIS — M542 Cervicalgia: Secondary | ICD-10-CM | POA: Insufficient documentation

## 2013-04-19 DIAGNOSIS — R51 Headache: Secondary | ICD-10-CM | POA: Insufficient documentation

## 2013-04-19 LAB — COMPREHENSIVE METABOLIC PANEL
ALT: 49 U/L — ABNORMAL HIGH (ref 0–35)
Alkaline Phosphatase: 148 U/L — ABNORMAL HIGH (ref 39–117)
Chloride: 102 mEq/L (ref 96–112)
GFR calc Af Amer: >90 mL/min (ref >90)
Glucose, Bld: 90 mg/dL (ref 70–99)
Potassium: 4.1 mEq/L (ref 3.5–5.1)
Sodium: 139 mEq/L (ref 135–145)
Total Bilirubin: 0.2 mg/dL — ABNORMAL LOW (ref 0.3–1.2)
Total Protein: 6.9 g/dL (ref 6.0–8.3)

## 2013-04-19 LAB — URINALYSIS, ROUTINE W REFLEX MICROSCOPIC
Glucose, UA: NEGATIVE mg/dL
Hgb urine dipstick: NEGATIVE
Leukocytes, UA: NEGATIVE
Specific Gravity, Urine: 1.015 (ref 1.005–1.030)
pH: 7.5 (ref 5.0–8.0)

## 2013-04-19 LAB — CBC
Hemoglobin: 10.4 g/dL — ABNORMAL LOW (ref 12.0–15.0)
MCHC: 32.1 g/dL (ref 30.0–36.0)
RBC: 4.21 MIL/uL (ref 3.87–5.11)
WBC: 11.8 10*3/uL — ABNORMAL HIGH (ref 4.0–10.5)

## 2013-04-19 LAB — LACTATE DEHYDROGENASE: LDH: 405 U/L — ABNORMAL HIGH (ref 94–250)

## 2013-04-19 NOTE — MAU Note (Signed)
Called in RX for labetalol 100mg  BID X 30 days, no refils. Called into [facility] on [address].

## 2013-04-19 NOTE — Progress Notes (Signed)
Slight elevated BP in Office. Seen by MD's while in hospital. Now for follow up since discharge for ongoing neck pain; Dr Ambrose Mantle spoke to me for evaluation of possible PDPHA and EBP. Since she has been home, she still has pain in her neck only. No other complaints of weakness,numbness, visual sx, or HA in other locations other than her neck.  She says the HA comes after some some time of laying supine and is relieved when she sits up. The medicines she takes help, and she takes it by the clock for sustained relief.   She is sitting straight up in the chair, has a happy and energetic affect, and shows no signs of distress.  She prefers not to have the EBP, and I see no reason to encourage it. The history / physical gives PDPHA no traction, and to me the risk would outweigh the benefit. I suggested symptomatic Rx.

## 2013-04-19 NOTE — MAU Note (Signed)
Neck pain causes the headache.  Is on percocet and flexaril.  Has been having the neck pain since labor. Right now has neck pain but no headache- pt is sitting upright, no headache.

## 2013-04-19 NOTE — MAU Note (Signed)
Dr. Ambrose Mantle gave verbal order to call in labetalol 100mg  po BID to pt pharmacy and follow up in office tomorrow for repeat labs.

## 2013-06-21 ENCOUNTER — Emergency Department (HOSPITAL_COMMUNITY)
Admission: EM | Admit: 2013-06-21 | Discharge: 2013-06-22 | Disposition: A | Discharge status: Home / Self Care | Payer: Medicaid Other | Attending: Emergency Medicine | Admitting: Emergency Medicine

## 2013-06-21 ENCOUNTER — Emergency Department (HOSPITAL_COMMUNITY): Payer: Medicaid Other

## 2013-06-21 ENCOUNTER — Encounter (HOSPITAL_COMMUNITY): Payer: Self-pay | Admitting: Emergency Medicine

## 2013-06-21 DIAGNOSIS — N289 Disorder of kidney and ureter, unspecified: Secondary | ICD-10-CM | POA: Insufficient documentation

## 2013-06-21 DIAGNOSIS — IMO0002 Reserved for concepts with insufficient information to code with codable children: Secondary | ICD-10-CM | POA: Insufficient documentation

## 2013-06-21 DIAGNOSIS — X500XXA Overexertion from strenuous movement or load, initial encounter: Secondary | ICD-10-CM | POA: Insufficient documentation

## 2013-06-21 DIAGNOSIS — Z87442 Personal history of urinary calculi: Secondary | ICD-10-CM | POA: Insufficient documentation

## 2013-06-21 DIAGNOSIS — S8392XA Sprain of unspecified site of left knee, initial encounter: Secondary | ICD-10-CM

## 2013-06-21 DIAGNOSIS — Z8742 Personal history of other diseases of the female genital tract: Secondary | ICD-10-CM | POA: Insufficient documentation

## 2013-06-21 DIAGNOSIS — I10 Essential (primary) hypertension: Secondary | ICD-10-CM | POA: Insufficient documentation

## 2013-06-21 DIAGNOSIS — Y9229 Other specified public building as the place of occurrence of the external cause: Secondary | ICD-10-CM | POA: Insufficient documentation

## 2013-06-21 DIAGNOSIS — Y990 Civilian activity done for income or pay: Secondary | ICD-10-CM | POA: Insufficient documentation

## 2013-06-21 NOTE — ED Notes (Signed)
Pt. reports left knee pain " gave out " onset this evening while standing at work , stated "throbbing pain ".

## 2013-06-22 NOTE — ED Provider Notes (Signed)
CSN: [id number]     Arrival date & time 06/21/13  2118 History   First MD Initiated Contact with Patient 06/21/13 2229     Chief Complaint  Patient presents with  . Knee Pain   (Consider location/radiation/quality/duration/timing/severity/associated sxs/prior Treatment) The history is provided by the patient. No language interpreter was used.  Regina Harrison is a 27 y/o F with PMHx of HTN presenting to the ED with left knee that started today while at work. Patient reported that while at work she was standing and her knee suddenly gave out. Patient reported that the pain is localized to the left knee without radiation, described as a throbbing sensation. Reported that pain worsens with extension of the knee with keeping the leg still makes the pain better. Patient reported that she had an injury to the left knee approximately 3 years ago where she landed on her left knee while out with friends - reported that every once in a while her knee gives out. Denied numbness, tingling, weakness, loss of sensation.  PCP none   Past Medical History  Diagnosis Date  . Renal disorder   . Hypertension   . Preeclampsia   . Kidney stones   . Preterm labor   . Pregnancy induced hypertension    Past Surgical History  Procedure Laterality Date  . Kidney stone surgery     Family History  Problem Relation Age of Onset  . Hypertension Mother    History  Substance Use Topics  . Smoking status: Never Smoker   . Smokeless tobacco: Not on file  . Alcohol Use: Yes   OB History   Grav Para Term Preterm Abortions TAB SAB Ect Mult Living   6 4 2 2 2  0 2 0 0 4     Review of Systems  Musculoskeletal: Positive for arthralgias (left knee pain ).  Neurological: Negative for weakness and numbness.  All other systems reviewed and are negative.    Allergies  Review of patient's allergies indicates no known allergies.  Home Medications  No current outpatient prescriptions on file. BP 139/83  Pulse  86  Temp(Src) 98.2 F (36.8 C) (Oral)  Resp 18  SpO2 100%  LMP 06/04/2013 Physical Exam  Nursing note and vitals reviewed. Constitutional: She is oriented to person, place, and time. She appears well-developed and well-nourished. No distress.  HENT:  Head: Normocephalic and atraumatic.  Neck: Normal range of motion. Neck supple.  Cardiovascular: Normal rate, regular rhythm and normal heart sounds.  Exam reveals no friction rub.   No murmur heard. Pulses:      Radial pulses are 2+ on the right side, and 2+ on the left side.       Dorsalis pedis pulses are 2+ on the right side, and 2+ on the left side.  Pulmonary/Chest: Effort normal and breath sounds normal. No respiratory distress. She has no wheezes. She has no rales.  Musculoskeletal: Normal range of motion.  Mild swelling noted. Negative erythema, inflammation, deformities noted to the left knee. Proper placement the patella-negative displacement. Full flexion and extension noted. Discomfort with valgus and various tension applied. Negative anterior and posterior draw sign. Discomfort upon palpation to the medial, posterior and anterior aspect of the left knee.  Neurological: She is alert and oriented to person, place, and time. She exhibits normal muscle tone. Coordination normal.  Strength 5+5+ lower tremors bilaterally with resistance applied, equal distribution Sensation intact to lower tremors bilaterally with differentiation to sharp and dull touch  Skin:  She is not diaphoretic.    ED Course  Procedures (including critical care time) Labs Review Labs Reviewed - No data to display Imaging Review Dg Knee Complete 4 Views Left  06/21/2013   CLINICAL DATA:  Twisting injury to left knee, with left knee pain.  EXAM: LEFT KNEE - COMPLETE 4+ VIEW  COMPARISON:  None.  FINDINGS: There is no evidence of fracture or dislocation. The joint spaces are preserved. No significant degenerative change is seen; the patellofemoral joint is  grossly unremarkable in appearance.  Trace joint fluid remains within normal limits. The visualized soft tissues are normal in appearance.  IMPRESSION: No evidence of fracture or dislocation.   Electronically Signed   By: Roanna Raider M.D.   On: 06/21/2013 22:24    EKG Interpretation   None       MDM   1. Knee sprain, left, initial encounter    Filed Vitals:   06/21/13 2122  BP: 139/83  Pulse: 86  Temp: 98.2 F (36.8 C)  Resp: 18   Patient presenting to emergency department with left knee pain that started today. Patient reports that her knee gave out and she landed on the floor-reported that the left knee as a constant throbbing sensation. Patient reports that she had her left knee dislocated approximately 3 years ago and has never been treated before. Alert and oriented. Mild swelling localized to the left knee. Negative findings for effusion. Negative erythema, inflammation, ecchymosis, deformities noted. Discomfort upon palpation to the posterior, lateral, anterior aspect of the knee. Negative anterior posterior drawer sign. Mild discomfort with valgus and varus tension applied. Full flexion and extension noted. Stable knee joint. Patient neurovascularly intact. Plain film of left knee negative for fracture or dislocation. Doubt septic joint. Suspicion to be knee sprain-cannot rule out ligamental versus menisci injury. Patient placed in knee immobilizer and crutches administered. Discharged patient. Discussed with patient to use ibuprofen as needed for discomfort. Discussed with patient rest, ice, elevation. Referred patient to orthopedic. Discussed with patient to continue to monitor symptoms and if symptoms are to worsen or change report back to emergency department - strict return structures given. Patient agreed to plan of care, understood, all questions answered    Raymon Mutton, PA-C 06/23/13 2225

## 2013-06-24 NOTE — ED Provider Notes (Signed)
Medical screening examination/treatment/procedure(s) were performed by non-physician practitioner and as supervising physician I was immediately available for consultation/collaboration.  EKG Interpretation   None        Johndavid Geralds R. Shahed Yeoman, MD 06/24/13 0729 

## 2014-01-14 ENCOUNTER — Emergency Department (HOSPITAL_COMMUNITY): Payer: Medicaid Other

## 2014-01-14 ENCOUNTER — Emergency Department (HOSPITAL_COMMUNITY)
Admission: EM | Admit: 2014-01-14 | Discharge: 2014-01-14 | Disposition: A | Discharge status: Home / Self Care | Payer: Self-pay | Attending: Emergency Medicine | Admitting: Emergency Medicine

## 2014-01-14 ENCOUNTER — Encounter (HOSPITAL_COMMUNITY): Payer: Self-pay | Admitting: Emergency Medicine

## 2014-01-14 DIAGNOSIS — N289 Disorder of kidney and ureter, unspecified: Secondary | ICD-10-CM | POA: Insufficient documentation

## 2014-01-14 DIAGNOSIS — R109 Unspecified abdominal pain: Secondary | ICD-10-CM | POA: Insufficient documentation

## 2014-01-14 DIAGNOSIS — R112 Nausea with vomiting, unspecified: Secondary | ICD-10-CM | POA: Insufficient documentation

## 2014-01-14 DIAGNOSIS — Z87442 Personal history of urinary calculi: Secondary | ICD-10-CM | POA: Insufficient documentation

## 2014-01-14 LAB — URINE MICROSCOPIC-ADD ON

## 2014-01-14 LAB — URINALYSIS, ROUTINE W REFLEX MICROSCOPIC
Bilirubin Urine: NEGATIVE
Glucose, UA: NEGATIVE mg/dL
Ketones, ur: NEGATIVE mg/dL
Nitrite: NEGATIVE
Protein, ur: NEGATIVE mg/dL
SPECIFIC GRAVITY, URINE: 1.016 (ref 1.005–1.030)
UROBILINOGEN UA: 0.2 mg/dL (ref 0.0–1.0)
pH: 7 (ref 5.0–8.0)

## 2014-01-14 LAB — PREGNANCY, URINE: PREG TEST UR: NEGATIVE

## 2014-01-14 MED ORDER — KETOROLAC TROMETHAMINE 30 MG/ML IJ SOLN
30.0000 mg | Freq: Once | INTRAMUSCULAR | Status: AC
  Administered 2014-01-14: 30 mg via INTRAVENOUS
  Filled 2014-01-14: qty 1

## 2014-01-14 MED ORDER — MORPHINE SULFATE 4 MG/ML IJ SOLN
4.0000 mg | Freq: Once | INTRAMUSCULAR | Status: AC
  Administered 2014-01-14: 4 mg via INTRAVENOUS
  Filled 2014-01-14: qty 1

## 2014-01-14 MED ORDER — ONDANSETRON 8 MG PO TBDP: ORAL_TABLET | ORAL | Status: DC

## 2014-01-14 MED ORDER — ONDANSETRON HCL 4 MG/2ML IJ SOLN
4.0000 mg | Freq: Once | INTRAMUSCULAR | Status: AC
  Administered 2014-01-14: 4 mg via INTRAVENOUS
  Filled 2014-01-14: qty 2

## 2014-01-14 NOTE — ED Notes (Signed)
Pt states that she began having left flank pain this morning. Pt has hx of kidney stones and had surgery in 2009. Pt states that this is her first episode since then. Pt reports N/V as well. Pt states that for the last several days she has had difficulty urinating.

## 2014-01-14 NOTE — ED Provider Notes (Signed)
CSN: [id number]     Arrival date & time 01/14/14  [id number] History   First MD Initiated Contact with Patient 01/14/14 [phone]     Chief Complaint  Patient presents with  . Flank Pain     (Consider location/radiation/quality/duration/timing/severity/associated sxs/prior Treatment) HPI Comments: Patient is a 28 year old female with history of renal calculi. She presents today with complaints of left flank and left lower quadrant pain that started acutely this morning. She has felt nauseated and states that she has vomited. She denies any diarrhea, constipation, or bloody stool.  Patient is a 28 y.o. female presenting with flank pain. The history is provided by the patient.  Flank Pain This is a new problem. The current episode started 1 to 2 hours ago. The problem occurs constantly. The problem has not changed since onset.Associated symptoms include abdominal pain. Nothing aggravates the symptoms. Nothing relieves the symptoms. She has tried nothing for the symptoms. The treatment provided no relief.    Past Medical History  Diagnosis Date  . Renal disorder   . Hypertension   . Preeclampsia   . Kidney stones   . Preterm labor   . Pregnancy induced hypertension    Past Surgical History  Procedure Laterality Date  . Kidney stone surgery     Family History  Problem Relation Age of Onset  . Hypertension Mother    History  Substance Use Topics  . Smoking status: Never Smoker   . Smokeless tobacco: Not on file  . Alcohol Use: Yes   OB History   Grav Para Term Preterm Abortions TAB SAB Ect Mult Living   6 4 2 2 2  0 2 0 0 4     Review of Systems  Gastrointestinal: Positive for abdominal pain.  Genitourinary: Positive for flank pain.  All other systems reviewed and are negative.     Allergies  Review of patient's allergies indicates no known allergies.  Home Medications   Prior to Admission medications   Medication Sig Start Date End Date Taking? Authorizing Provider   ibuprofen (ADVIL,MOTRIN) 200 MG tablet Take 600 mg by mouth every 6 (six) hours as needed for mild pain.   Yes Historical Provider, MD   BP 130/91  Pulse 73  Temp(Src) 97.9 F (36.6 C) (Oral)  Resp 20  SpO2 100%  LMP 01/07/2014 Physical Exam  Nursing note and vitals reviewed. Constitutional: She is oriented to person, place, and time. She appears well-developed and well-nourished. No distress.  HENT:  Head: Normocephalic and atraumatic.  Neck: Normal range of motion. Neck supple.  Cardiovascular: Normal rate and regular rhythm.  Exam reveals no gallop and no friction rub.   No murmur heard. Pulmonary/Chest: Effort normal and breath sounds normal. No respiratory distress. She has no wheezes.  Abdominal: Soft. Bowel sounds are normal. She exhibits no distension. There is no tenderness.  Musculoskeletal: Normal range of motion.  Neurological: She is alert and oriented to person, place, and time.  Skin: Skin is warm and dry. She is not diaphoretic.    ED Course  Procedures (including critical care time) Labs Review Labs Reviewed  URINALYSIS, ROUTINE W REFLEX MICROSCOPIC  PREGNANCY, URINE    Imaging Review No results found.   EKG Interpretation None      MDM   Final diagnoses:  None    CT scan reveals no acute abnormality. While in the ER she went to the bathroom and urinated. She stated that she noticed a small dark speck in the toilet. I suspect she  may have passed the stone as her symptoms resolved immediately afterward. She is now feeling better and I believe appropriate for discharge. She will return if her symptoms worsen or recur.    Geoffery Lyons, MD 01/14/14 [phone]

## 2014-01-14 NOTE — Discharge Instructions (Signed)
Zofran as needed for nausea.  Return to the emergency department if he develops severe pain, high fever, or any other new and concerning symptoms.   Flank Pain Flank pain refers to pain that is located on the side of the body between the upper abdomen and the back. The pain may occur over a short period of time (acute) or may be long-term or reoccurring (chronic). It may be mild or severe. Flank pain can be caused by many things. CAUSES  Some of the more common causes of flank pain include:  Muscle strains.   Muscle spasms.   A disease of your spine (vertebral disk disease).   A lung infection (pneumonia).   Fluid around your lungs (pulmonary edema).   A kidney infection.   Kidney stones.   A very painful skin rash caused by the chickenpox virus (shingles).   Gallbladder disease.  HOME CARE INSTRUCTIONS  Home care will depend on the cause of your pain. In general,  Rest as directed by your caregiver.  Drink enough fluids to keep your urine clear or pale yellow.  Only take over-the-counter or prescription medicines as directed by your caregiver. Some medicines may help relieve the pain.  Tell your caregiver about any changes in your pain.  Follow up with your caregiver as directed. SEEK IMMEDIATE MEDICAL CARE IF:   Your pain is not controlled with medicine.   You have new or worsening symptoms.  Your pain increases.   You have abdominal pain.   You have shortness of breath.   You have persistent nausea or vomiting.   You have swelling in your abdomen.   You feel faint or pass out.   You have blood in your urine.  You have a fever or persistent symptoms for more than 2 3 days.  You have a fever and your symptoms suddenly get worse. MAKE SURE YOU:   Understand these instructions.  Will watch your condition.  Will get help right away if you are not doing well or get worse. Document Released: 09/19/2005 Document Revised: 04/22/2012  Document Reviewed: 03/12/2012 [facility] Patient Information 2014 [place], [place].

## 2014-01-19 ENCOUNTER — Emergency Department (HOSPITAL_COMMUNITY)
Admission: EM | Admit: 2014-01-19 | Discharge: 2014-01-19 | Discharge status: Against Medical Advice | Payer: Medicaid Other | Attending: Emergency Medicine | Admitting: Emergency Medicine

## 2014-01-19 ENCOUNTER — Encounter (HOSPITAL_COMMUNITY): Payer: Self-pay | Admitting: Emergency Medicine

## 2014-01-19 DIAGNOSIS — R110 Nausea: Secondary | ICD-10-CM | POA: Insufficient documentation

## 2014-01-19 DIAGNOSIS — R109 Unspecified abdominal pain: Secondary | ICD-10-CM | POA: Insufficient documentation

## 2014-01-19 DIAGNOSIS — I10 Essential (primary) hypertension: Secondary | ICD-10-CM | POA: Insufficient documentation

## 2014-01-19 LAB — URINALYSIS, ROUTINE W REFLEX MICROSCOPIC
Bilirubin Urine: NEGATIVE
GLUCOSE, UA: NEGATIVE mg/dL
KETONES UR: NEGATIVE mg/dL
Leukocytes, UA: NEGATIVE
Nitrite: NEGATIVE
PH: 6 (ref 5.0–8.0)
PROTEIN: NEGATIVE mg/dL
Specific Gravity, Urine: 1.011 (ref 1.005–1.030)
Urobilinogen, UA: 0.2 mg/dL (ref 0.0–1.0)

## 2014-01-19 LAB — COMPREHENSIVE METABOLIC PANEL
ALK PHOS: 82 U/L (ref 39–117)
ALT: 13 U/L (ref 0–35)
AST: 17 U/L (ref 0–37)
Albumin: 4.5 g/dL (ref 3.5–5.2)
BUN: 10 mg/dL (ref 6–23)
CALCIUM: 9.7 mg/dL (ref 8.4–10.5)
CO2: 23 mEq/L (ref 19–32)
Chloride: 100 mEq/L (ref 96–112)
Creatinine, Ser: 0.62 mg/dL (ref 0.50–1.10)
GFR calc non Af Amer: >90 mL/min (ref >90)
GLUCOSE: 97 mg/dL (ref 70–99)
Potassium: 4 mEq/L (ref 3.7–5.3)
Sodium: 140 mEq/L (ref 137–147)
TOTAL PROTEIN: 8.3 g/dL (ref 6.0–8.3)
Total Bilirubin: 0.3 mg/dL (ref 0.3–1.2)

## 2014-01-19 LAB — CBC WITH DIFFERENTIAL/PLATELET
Basophils Absolute: 0 10*3/uL (ref 0.0–0.1)
Basophils Relative: 0 % (ref 0–1)
EOS ABS: 0.3 10*3/uL (ref 0.0–0.7)
EOS PCT: 2 % (ref 0–5)
HCT: 38.3 % (ref 36.0–46.0)
HEMOGLOBIN: 13.1 g/dL (ref 12.0–15.0)
LYMPHS ABS: 2.1 10*3/uL (ref 0.7–4.0)
Lymphocytes Relative: 16 % (ref 12–46)
MCH: 27.9 pg (ref 26.0–34.0)
MCHC: 34.2 g/dL (ref 30.0–36.0)
MCV: 81.7 fL (ref 78.0–100.0)
MONOS PCT: 5 % (ref 3–12)
Monocytes Absolute: 0.7 10*3/uL (ref 0.1–1.0)
Neutro Abs: 10.7 10*3/uL — ABNORMAL HIGH (ref 1.7–7.7)
Neutrophils Relative %: 77 % (ref 43–77)
Platelets: 321 10*3/uL (ref 150–400)
RBC: 4.69 MIL/uL (ref 3.87–5.11)
RDW: 14.5 % (ref 11.5–15.5)
WBC: 13.8 10*3/uL — ABNORMAL HIGH (ref 4.0–10.5)

## 2014-01-19 LAB — URINE MICROSCOPIC-ADD ON

## 2014-01-19 LAB — POC URINE PREG, ED: Preg Test, Ur: NEGATIVE

## 2014-01-19 NOTE — ED Notes (Signed)
Pt presents to department for evaluation of L sided flank pain. States she was seen for same last week. Now states pain has returned. Also states nausea. Pt is alert and oriented x4. States L flank feels sore.

## 2014-05-22 ENCOUNTER — Encounter (HOSPITAL_COMMUNITY): Payer: Self-pay | Admitting: Emergency Medicine

## 2014-05-22 ENCOUNTER — Emergency Department (HOSPITAL_COMMUNITY)
Admission: EM | Admit: 2014-05-22 | Discharge: 2014-05-22 | Disposition: A | Discharge status: Home / Self Care | Payer: Self-pay | Attending: Emergency Medicine | Admitting: Emergency Medicine

## 2014-05-22 ENCOUNTER — Emergency Department (HOSPITAL_COMMUNITY): Payer: Medicaid Other

## 2014-05-22 DIAGNOSIS — Z8751 Personal history of pre-term labor: Secondary | ICD-10-CM | POA: Insufficient documentation

## 2014-05-22 DIAGNOSIS — I10 Essential (primary) hypertension: Secondary | ICD-10-CM | POA: Insufficient documentation

## 2014-05-22 DIAGNOSIS — Z8759 Personal history of other complications of pregnancy, childbirth and the puerperium: Secondary | ICD-10-CM | POA: Insufficient documentation

## 2014-05-22 DIAGNOSIS — Z3202 Encounter for pregnancy test, result negative: Secondary | ICD-10-CM | POA: Insufficient documentation

## 2014-05-22 DIAGNOSIS — Z9889 Other specified postprocedural states: Secondary | ICD-10-CM | POA: Insufficient documentation

## 2014-05-22 DIAGNOSIS — N200 Calculus of kidney: Secondary | ICD-10-CM | POA: Insufficient documentation

## 2014-05-22 LAB — URINALYSIS, ROUTINE W REFLEX MICROSCOPIC
BILIRUBIN URINE: NEGATIVE
Glucose, UA: 100 mg/dL — AB
Glucose, UA: NEGATIVE mg/dL
Ketones, ur: 40 mg/dL — AB
Ketones, ur: NEGATIVE mg/dL
Leukocytes, UA: NEGATIVE
Nitrite: POSITIVE — AB
Nitrite: NEGATIVE
PH: 6.5 (ref 5.0–8.0)
Protein, ur: >300 mg/dL — AB
Protein, ur: NEGATIVE mg/dL
SPECIFIC GRAVITY, URINE: 1.024 (ref 1.005–1.030)
SPECIFIC GRAVITY, URINE: 1.013 (ref 1.005–1.030)
Urobilinogen, UA: 0.2 mg/dL (ref 0.0–1.0)
pH: 5 (ref 5.0–8.0)

## 2014-05-22 LAB — CBC WITH DIFFERENTIAL/PLATELET
Basophils Absolute: 0 10*3/uL (ref 0.0–0.1)
Basophils Relative: 1 % (ref 0–1)
EOS ABS: 0.2 10*3/uL (ref 0.0–0.7)
EOS PCT: 3 % (ref 0–5)
HCT: 37.1 % (ref 36.0–46.0)
Hemoglobin: 12.8 g/dL (ref 12.0–15.0)
LYMPHS ABS: 2 10*3/uL (ref 0.7–4.0)
Lymphocytes Relative: 28 % (ref 12–46)
MCH: 28.6 pg (ref 26.0–34.0)
MCHC: 34.5 g/dL (ref 30.0–36.0)
MCV: 83 fL (ref 78.0–100.0)
MONO ABS: 0.4 10*3/uL (ref 0.1–1.0)
MONOS PCT: 6 % (ref 3–12)
Neutro Abs: 4.3 10*3/uL (ref 1.7–7.7)
Neutrophils Relative %: 62 % (ref 43–77)
PLATELETS: 296 10*3/uL (ref 150–400)
RBC: 4.47 MIL/uL (ref 3.87–5.11)
RDW: 13.5 % (ref 11.5–15.5)
WBC: 6.9 10*3/uL (ref 4.0–10.5)

## 2014-05-22 LAB — URINE MICROSCOPIC-ADD ON

## 2014-05-22 LAB — BASIC METABOLIC PANEL
Anion gap: 13 (ref 5–15)
BUN: 8 mg/dL (ref 6–23)
CALCIUM: 9 mg/dL (ref 8.4–10.5)
CO2: 21 mEq/L (ref 19–32)
Chloride: 103 mEq/L (ref 96–112)
Creatinine, Ser: 0.63 mg/dL (ref 0.50–1.10)
GFR calc Af Amer: >90 mL/min (ref >90)
GFR calc non Af Amer: >90 mL/min (ref >90)
GLUCOSE: 120 mg/dL — AB (ref 70–99)
Potassium: 3.7 mEq/L (ref 3.7–5.3)
SODIUM: 137 meq/L (ref 137–147)

## 2014-05-22 LAB — PREGNANCY, URINE: Preg Test, Ur: NEGATIVE

## 2014-05-22 MED ORDER — DEXTROSE 5 % IV SOLN
1.0000 g | Freq: Once | INTRAVENOUS | Status: AC
  Administered 2014-05-22: 1 g via INTRAVENOUS
  Filled 2014-05-22: qty 10

## 2014-05-22 MED ORDER — KETOROLAC TROMETHAMINE 30 MG/ML IJ SOLN
30.0000 mg | Freq: Once | INTRAMUSCULAR | Status: AC
  Administered 2014-05-22: 30 mg via INTRAVENOUS
  Filled 2014-05-22: qty 1

## 2014-05-22 MED ORDER — ONDANSETRON HCL 4 MG/2ML IJ SOLN
4.0000 mg | Freq: Once | INTRAMUSCULAR | Status: AC
  Administered 2014-05-22: 4 mg via INTRAVENOUS
  Filled 2014-05-22: qty 2

## 2014-05-22 MED ORDER — SODIUM CHLORIDE 0.9 % IV BOLUS (SEPSIS)
1000.0000 mL | Freq: Once | INTRAVENOUS | Status: AC
  Administered 2014-05-22: 1000 mL via INTRAVENOUS

## 2014-05-22 MED ORDER — HYDROMORPHONE HCL 1 MG/ML IJ SOLN
1.0000 mg | Freq: Once | INTRAMUSCULAR | Status: AC
  Administered 2014-05-22: 1 mg via INTRAVENOUS
  Filled 2014-05-22: qty 1

## 2014-05-22 MED ORDER — OXYCODONE-ACETAMINOPHEN 5-325 MG PO TABS
2.0000 | ORAL_TABLET | Freq: Once | ORAL | Status: AC
  Administered 2014-05-22: 2 via ORAL
  Filled 2014-05-22: qty 2

## 2014-05-22 MED ORDER — PROMETHAZINE HCL 25 MG/ML IJ SOLN
25.0000 mg | Freq: Once | INTRAMUSCULAR | Status: AC
  Administered 2014-05-22: 25 mg via INTRAVENOUS
  Filled 2014-05-22 (×2): qty 1

## 2014-05-22 MED ORDER — CEPHALEXIN 500 MG PO CAPS: 500.0000 mg | ORAL_CAPSULE | Freq: Four times a day (QID) | ORAL | Status: DC

## 2014-05-22 NOTE — ED Notes (Signed)
Urology at bedside.

## 2014-05-22 NOTE — Consult Note (Signed)
Urology Consult   Physician requesting consult: Glynn OctaveStephen Rancour  Reason for consult: Kidney stone  History of Present Illness: Regina Harrison is a 28 y.o. female with a prior history of urolithiasis who presented to the emergency room here at [facility] with a several hour history of acute onset of left flank pain with nausea and vomiting. There was no fever, chills or right-sided abdominal pain. The pain was reminiscent of prior calculi. Evaluation here revealed a left distal ureteral stone with proximal hydroureteronephrosis. Voided revealed pyuria , Leukocyte and nitrite positive. The patient is on her menses.  Urologic history is significant for prior left ureteroscopic stone extraction in 2009. She passed a stone this past summer.  She denies a history of voiding or storage urinary symptoms, hematuria, UTIs, STDs, urolithiasis, GU malignancy/trauma/surgery.  Past Medical History  Diagnosis Date  . Renal disorder   . Hypertension   . Preeclampsia   . Kidney stones   . Preterm labor   . Pregnancy induced hypertension     Past Surgical History  Procedure Laterality Date  . Kidney stone surgery       Current Hospital Medications: Scheduled Meds: Continuous Infusions: PRN Meds:.    Allergies: No Known Allergies  Family History  Problem Relation Age of Onset  . Hypertension Mother     Social History:  reports that she has never smoked. She does not have any smokeless tobacco history on file. She reports that she drinks alcohol. She reports that she does not use illicit drugs.  ROS: A complete review of systems was performed.  All systems are negative except for pertinent findings as noted.  Physical Exam:  Vital signs in last 24 hours: Temp:  [98.1 F (36.7 C)-98.8 F (37.1 C)] 98.1 F (36.7 C) (10/11 1400) Pulse Rate:  [68-96] 76 (10/11 1615) Resp:  [16-22] 16 (10/11 1615) BP: (110-128)/(66-86) 122/75 mmHg (10/11 1615) SpO2:  [96 %-100 %] 96 % (10/11  1615) Weight:  [74.844 kg (165 lb)] 74.844 kg (165 lb) (10/11 0919) General:  Alert and oriented, No acute distress HEENT: Normocephalic, atraumatic Neck: No JVD or lymphadenopathy Cardiovascular:  normal rate  Abdomen:  mildly obese, soft, mild left CVA and lower quadrant tenderness no rebound or guarding  Extremities: No edema Neurologic: Grossly intact  Laboratory Data:   Recent Labs  05/22/14 0945  WBC 6.9  HGB 12.8  HCT 37.1  PLT 296     Recent Labs  05/22/14 0945  NA 137  K 3.7  CL 103  GLUCOSE 120*  BUN 8  CALCIUM 9.0  CREATININE 0.63     Results for orders placed during the hospital encounter of 05/22/14 (from the past 24 hour(s))  CBC WITH DIFFERENTIAL     Status: None   Collection Time    05/22/14  9:45 AM      Result Value Ref Range   WBC 6.9  4.0 - 10.5 K/uL   RBC 4.47  3.87 - 5.11 MIL/uL   Hemoglobin 12.8  12.0 - 15.0 g/dL   HCT 09.837.1  11.936.0 - 14.746.0 %   MCV 83.0  78.0 - 100.0 fL   MCH 28.6  26.0 - 34.0 pg   MCHC 34.5  30.0 - 36.0 g/dL   RDW 82.913.5  56.211.5 - 13.015.5 %   Platelets 296  150 - 400 K/uL   Neutrophils Relative % 62  43 - 77 %   Neutro Abs 4.3  1.7 - 7.7 K/uL   Lymphocytes Relative 28  12 - 46 %   Lymphs Abs 2.0  0.7 - 4.0 K/uL   Monocytes Relative 6  3 - 12 %   Monocytes Absolute 0.4  0.1 - 1.0 K/uL   Eosinophils Relative 3  0 - 5 %   Eosinophils Absolute 0.2  0.0 - 0.7 K/uL   Basophils Relative 1  0 - 1 %   Basophils Absolute 0.0  0.0 - 0.1 K/uL  BASIC METABOLIC PANEL     Status: Abnormal   Collection Time    05/22/14  9:45 AM      Result Value Ref Range   Sodium 137  137 - 147 mEq/L   Potassium 3.7  3.7 - 5.3 mEq/L   Chloride 103  96 - 112 mEq/L   CO2 21  19 - 32 mEq/L   Glucose, Bld 120 (*) 70 - 99 mg/dL   BUN 8  6 - 23 mg/dL   Creatinine, Ser 8.290.63  0.50 - 1.10 mg/dL   Calcium 9.0  8.4 - 56.210.5 mg/dL   GFR calc non Af Amer >90  >90 mL/min   GFR calc Af Amer >90  >90 mL/min   Anion gap 13  5 - 15  PREGNANCY, URINE     Status:  None   Collection Time    05/22/14 10:27 AM      Result Value Ref Range   Preg Test, Ur NEGATIVE  NEGATIVE  URINALYSIS, ROUTINE W REFLEX MICROSCOPIC     Status: Abnormal   Collection Time    05/22/14 10:28 AM      Result Value Ref Range   Color, Urine RED (*) YELLOW   APPearance TURBID (*) CLEAR   Specific Gravity, Urine 1.024  1.005 - 1.030   pH 5.0  5.0 - 8.0   Glucose, UA 100 (*) NEGATIVE mg/dL   Hgb urine dipstick LARGE (*) NEGATIVE   Bilirubin Urine LARGE (*) NEGATIVE   Ketones, ur 40 (*) NEGATIVE mg/dL   Protein, ur >130>300 (*) NEGATIVE mg/dL   Urobilinogen, UA >8.6>8.0 (*) 0.0 - 1.0 mg/dL   Nitrite POSITIVE (*) NEGATIVE   Leukocytes, UA LARGE (*) NEGATIVE  URINE MICROSCOPIC-ADD ON     Status: Abnormal   Collection Time    05/22/14 10:28 AM      Result Value Ref Range   Squamous Epithelial / LPF RARE  RARE   WBC, UA 3-6  <3 WBC/hpf   RBC / HPF TOO NUMEROUS TO COUNT  <3 RBC/hpf   Bacteria, UA MANY (*) RARE  URINALYSIS, ROUTINE W REFLEX MICROSCOPIC     Status: Abnormal   Collection Time    05/22/14  3:18 PM      Result Value Ref Range   Color, Urine YELLOW  YELLOW   APPearance CLEAR  CLEAR   Specific Gravity, Urine 1.013  1.005 - 1.030   pH 6.5  5.0 - 8.0   Glucose, UA NEGATIVE  NEGATIVE mg/dL   Hgb urine dipstick SMALL (*) NEGATIVE   Bilirubin Urine NEGATIVE  NEGATIVE   Ketones, ur NEGATIVE  NEGATIVE mg/dL   Protein, ur NEGATIVE  NEGATIVE mg/dL   Urobilinogen, UA 0.2  0.0 - 1.0 mg/dL   Nitrite NEGATIVE  NEGATIVE   Leukocytes, UA NEGATIVE  NEGATIVE  URINE MICROSCOPIC-ADD ON     Status: Abnormal   Collection Time    05/22/14  3:18 PM      Result Value Ref Range   Squamous Epithelial / LPF FEW (*) RARE  RBC / HPF 3-6  <3 RBC/hpf   No results found for this or any previous visit (from the past 240 hour(s)).  Renal Function:  Recent Labs  05/22/14 0945  CREATININE 0.63   Estimated Creatinine Clearance: 100.6 ml/min (by C-G formula based on Cr of  0.63).  Radiologic Imaging: Ct Abdomen Pelvis Wo Contrast  05/22/2014   CLINICAL DATA:  Left flank pain and history of renal calculi.  EXAM: CT ABDOMEN AND PELVIS WITHOUT CONTRAST  TECHNIQUE: Multidetector CT imaging of the abdomen and pelvis was performed following the standard protocol without IV contrast.  COMPARISON:  01/14/2014  FINDINGS: There is moderate left hydronephrosis and hydroureter. As the ureter is followed into the pelvis, there is an elongated calculus within the distal ureter nearly at the ureterovesical junction and measuring approximately 6 mm in length. No other left-sided calculi. The right kidney and ureter are unremarkable. The bladder is decompressed.  Unenhanced appearance of the liver, gallbladder, pancreas, spleen, adrenal glands and bowel are within normal limits. No abnormal fluid collections. No incidental masses or enlarged lymph nodes. No hernias are identified. The uterus and adnexal regions are unremarkable by CT.  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 No significant listhesis. There is a mild leftward convex scoliosis of the lumbar spine.  IMPRESSION: Left hydronephrosis secondary to a 6 mm calculus located in the distal ureter and nearly at the ureterovesical junction. 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 No associated significant listhesis.   Electronically Signed   By: Irish LackGlenn  Yamagata M.D.   On: 05/22/2014 12:57   Voided urinalysis looked infected. Catheterized urinalysis was clear. Her CT images were personally reviewed. The left distal ureteral stone is approximately 6 mm long 3 mm wide.  I independently reviewed the above imaging studies.  Impression/Assessment:  Left distal ureteral stone, 6 x 3 mm. This should pass. Initial urinalysis was spurious-possible UTI. Catheterized urinalysis was clear  Plan:  I feel  comfortable letting her go home with pain medications, I don't think she needs antibiotics at this point, and I would like her to follow up with our office this week. She was given our card. She was also given tamsulosin and ondansetron.

## 2014-05-22 NOTE — ED Provider Notes (Signed)
CSN: [id number]     Arrival date & time 05/22/14  0913 History   First MD Initiated Contact with Patient 05/22/14 [phone]     Chief Complaint  Patient presents with  . Flank Pain  . Nausea     (Consider location/radiation/quality/duration/timing/severity/associated sxs/prior Treatment) HPI Comments: Patient presents with left-sided flank pain that onset with nausea and vomiting at 7 AM. It is constant. History of kidney stones previous lithotripsy. Denies any fever. Endorses hematuria but is currently on menstrual cycle. Denies any abdominal pain, chest pain or shortness of breath. No focal weakness, numbness or tingling. No bowel or bladder incontinence.   The history is provided by the patient and a relative. The history is limited by the condition of the patient.    Past Medical History  Diagnosis Date  . Renal disorder   . Hypertension   . Preeclampsia   . Kidney stones   . Preterm labor   . Pregnancy induced hypertension    Past Surgical History  Procedure Laterality Date  . Kidney stone surgery     Family History  Problem Relation Age of Onset  . Hypertension Mother    History  Substance Use Topics  . Smoking status: Never Smoker   . Smokeless tobacco: Not on file  . Alcohol Use: Yes   OB History   Grav Para Term Preterm Abortions TAB SAB Ect Mult Living   6 4 2 2 2  0 2 0 0 4     Review of Systems  Constitutional: Positive for activity change and appetite change. Negative for fever.  Respiratory: Negative for cough, chest tightness and shortness of breath.   Cardiovascular: Negative for chest pain.  Gastrointestinal: Positive for nausea and vomiting. Negative for abdominal pain.  Genitourinary: Positive for dysuria, hematuria and flank pain. Negative for vaginal bleeding and vaginal discharge.  Musculoskeletal: Negative for arthralgias, myalgias and neck pain.  Skin: Negative for rash.  Neurological: Negative for dizziness, weakness and headaches.  A complete  10 system review of systems was obtained and all systems are negative except as noted in the HPI and PMH.      Allergies  Review of patient's allergies indicates no known allergies.  Home Medications   Prior to Admission medications   Medication Sig Start Date End Date Taking? Authorizing Provider  ibuprofen (ADVIL,MOTRIN) 200 MG tablet Take 400 mg by mouth every 6 (six) hours as needed for mild pain.    Yes Historical Provider, MD  cephALEXin (KEFLEX) 500 MG capsule Take 1 capsule (500 mg total) by mouth 4 (four) times daily. 05/22/14   Glynn Octave, MD   BP 122/75  Pulse 76  Temp(Src) 98.1 F (36.7 C) (Oral)  Resp 16  Ht 5\' 3"  (1.6 m)  Wt 165 lb (74.844 kg)  BMI 29.24 kg/m2  SpO2 96%  LMP 05/22/2014  Breastfeeding? No Physical Exam  Nursing note and vitals reviewed. Constitutional: She is oriented to person, place, and time. She appears well-developed and well-nourished. She appears distressed.  uncomfortable  HENT:  Head: Normocephalic and atraumatic.  Mouth/Throat: Oropharynx is clear and moist. No oropharyngeal exudate.  Eyes: Conjunctivae and EOM are normal. Pupils are equal, round, and reactive to light.  Neck: Normal range of motion. Neck supple.  No meningismus.  Cardiovascular: Normal rate, regular rhythm, normal heart sounds and intact distal pulses.   No murmur heard. Pulmonary/Chest: Effort normal and breath sounds normal. No respiratory distress.  Abdominal: Soft. There is no tenderness. There is no rebound and  no guarding.  Musculoskeletal: Normal range of motion. She exhibits tenderness. She exhibits no edema.  L CVAT  Neurological: She is alert and oriented to person, place, and time. No cranial nerve deficit. She exhibits normal muscle tone. Coordination normal.  No ataxia on finger to nose bilaterally. No pronator drift. 5/5 strength throughout. CN 2-12 intact. Negative Romberg. Equal grip strength. Sensation intact. Gait is normal.   Skin: Skin is  warm.  Psychiatric: She has a normal mood and affect. Her behavior is normal.    ED Course  Procedures (including critical care time) Labs Review Labs Reviewed  URINALYSIS, ROUTINE W REFLEX MICROSCOPIC - Abnormal; Notable for the following:    Color, Urine RED (*)    APPearance TURBID (*)    Glucose, UA 100 (*)    Hgb urine dipstick LARGE (*)    Bilirubin Urine LARGE (*)    Ketones, ur 40 (*)    Protein, ur >300 (*)    Urobilinogen, UA >8.0 (*)    Nitrite POSITIVE (*)    Leukocytes, UA LARGE (*)    All other components within normal limits  BASIC METABOLIC PANEL - Abnormal; Notable for the following:    Glucose, Bld 120 (*)    All other components within normal limits  URINE MICROSCOPIC-ADD ON - Abnormal; Notable for the following:    Bacteria, UA MANY (*)    All other components within normal limits  URINALYSIS, ROUTINE W REFLEX MICROSCOPIC - Abnormal; Notable for the following:    Hgb urine dipstick SMALL (*)    All other components within normal limits  URINE MICROSCOPIC-ADD ON - Abnormal; Notable for the following:    Squamous Epithelial / LPF FEW (*)    All other components within normal limits  URINE CULTURE  PREGNANCY, URINE  CBC WITH DIFFERENTIAL    Imaging Review Ct Abdomen Pelvis Wo Contrast  05/22/2014   CLINICAL DATA:  Left flank pain and history of renal calculi.  EXAM: CT ABDOMEN AND PELVIS WITHOUT CONTRAST  TECHNIQUE: Multidetector CT imaging of the abdomen and pelvis was performed following the standard protocol without IV contrast.  COMPARISON:  01/14/2014  FINDINGS: There is moderate left hydronephrosis and hydroureter. As the ureter is followed into the pelvis, there is an elongated calculus within the distal ureter nearly at the ureterovesical junction and measuring approximately 6 mm in length. No other left-sided calculi. The right kidney and ureter are unremarkable. The bladder is decompressed.  Unenhanced appearance of the liver, gallbladder, pancreas,  spleen, adrenal glands and bowel are within normal limits. No abnormal fluid collections. No incidental masses or enlarged lymph nodes. No hernias are identified. The uterus and adnexal regions are unremarkable by CT.  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 No significant listhesis. There is a mild leftward convex scoliosis of the lumbar spine.  IMPRESSION: Left hydronephrosis secondary to a 6 mm calculus located in the distal ureter and nearly at the ureterovesical junction. 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 No associated significant listhesis.   Electronically Signed   By: Irish LackGlenn  Yamagata M.D.   On: 05/22/2014 12:57     EKG Interpretation None      MDM   Final diagnoses:  Kidney stone   Flank pain with nausea vomiting and hematuria history of kidney stones.  UA consistent with infection. Patient started on Rocephin, culture sent. We'll obtain CT  to rule out infected stone.  CT shows 7 mm calculus in the distal left UVJ. Creatinine normal.   Catheter UA sample shows no infection. Voided sample likely spurious.  Discussed with Dr. Retta Dionesdahlstedt of urology who has seen patient and agrees patient does not appear to be infected, toxic or septic. Will discharge with pain and nausea control. Followup with urology office this week.  Dr. Retta Dionesahlstedt gave patient prescriptions for Percocet, Zofran and Flomax Patient is tolerating PO and pain is controlled.  Return precautions discussed.  BP 122/75  Pulse 76  Temp(Src) 98.1 F (36.7 C) (Oral)  Resp 16  Ht 5\' 3"  (1.6 m)  Wt 165 lb (74.844 kg)  BMI 29.24 kg/m2  SpO2 96%  LMP 05/22/2014  Breastfeeding? No   Glynn OctaveStephen Jobin Montelongo, MD 05/22/14 [phone]

## 2014-05-22 NOTE — Discharge Instructions (Signed)

## 2014-05-22 NOTE — ED Notes (Signed)
Pt c/o left flank pain with nausea onset 0700 today. Pt has history of kidney stones.

## 2014-05-22 NOTE — ED Notes (Signed)
In and out done y Regina Harrison with Regina Harrison

## 2014-05-24 LAB — URINE CULTURE
Colony Count: NO GROWTH
Culture: NO GROWTH

## 2014-06-02 ENCOUNTER — Other Ambulatory Visit: Payer: Self-pay | Admitting: Urology

## 2014-06-08 ENCOUNTER — Encounter (HOSPITAL_BASED_OUTPATIENT_CLINIC_OR_DEPARTMENT_OTHER): Payer: Self-pay | Admitting: *Deleted

## 2014-06-10 ENCOUNTER — Encounter (HOSPITAL_BASED_OUTPATIENT_CLINIC_OR_DEPARTMENT_OTHER): Payer: Self-pay | Admitting: *Deleted

## 2014-06-10 NOTE — Progress Notes (Signed)
NPO AFTER MN.  ARRIVE AT 0715. CURRENT LAB RESULTS IN CHART AND EPIC. MAY TAKE PAIN RX AM DOS W/ SIPS OF WATER.

## 2014-06-13 ENCOUNTER — Encounter (HOSPITAL_BASED_OUTPATIENT_CLINIC_OR_DEPARTMENT_OTHER): Payer: Self-pay | Admitting: *Deleted

## 2014-06-13 NOTE — Anesthesia Preprocedure Evaluation (Addendum)
Anesthesia Evaluation  Patient identified by MRN, date of birth, ID band Patient awake    Reviewed: Allergy & Precautions, H&P , NPO status , Patient's Chart, lab work & pertinent test results  Airway Mallampati: II  TM Distance: >3 FB Neck ROM: full    Dental no notable dental hx. (+) Teeth Intact, Dental Advisory Given   Pulmonary neg pulmonary ROS,  breath sounds clear to auscultation  Pulmonary exam normal       Cardiovascular Exercise Tolerance: Good negative cardio ROS  Rhythm:regular Rate:Normal     Neuro/Psych negative neurological ROS  negative psych ROS   GI/Hepatic negative GI ROS, Neg liver ROS,   Endo/Other  negative endocrine ROS  Renal/GU negative Renal ROS  negative genitourinary   Musculoskeletal   Abdominal   Peds  Hematology negative hematology ROS (+)   Anesthesia Other Findings   Reproductive/Obstetrics negative OB ROS                             Anesthesia Physical Anesthesia Plan  ASA: I  Anesthesia Plan: General   Post-op Pain Management:    Induction: Intravenous  Airway Management Planned: LMA  Additional Equipment:   Intra-op Plan:   Post-operative Plan:   Informed Consent: I have reviewed the patients History and Physical, chart, labs and discussed the procedure including the risks, benefits and alternatives for the proposed anesthesia with the patient or authorized representative who has indicated his/her understanding and acceptance.   Dental Advisory Given  Plan Discussed with: CRNA and Surgeon  Anesthesia Plan Comments:         Anesthesia Quick Evaluation  

## 2014-06-14 ENCOUNTER — Encounter (HOSPITAL_BASED_OUTPATIENT_CLINIC_OR_DEPARTMENT_OTHER)
Admission: RE | Disposition: A | Discharge status: Home / Self Care | Payer: Self-pay | Source: Ambulatory Visit | Attending: Urology

## 2014-06-14 ENCOUNTER — Ambulatory Visit (HOSPITAL_BASED_OUTPATIENT_CLINIC_OR_DEPARTMENT_OTHER): Payer: Medicaid Other | Admitting: Anesthesiology

## 2014-06-14 ENCOUNTER — Encounter (HOSPITAL_BASED_OUTPATIENT_CLINIC_OR_DEPARTMENT_OTHER): Payer: Self-pay | Admitting: *Deleted

## 2014-06-14 ENCOUNTER — Ambulatory Visit (HOSPITAL_BASED_OUTPATIENT_CLINIC_OR_DEPARTMENT_OTHER)
Admission: RE | Admit: 2014-06-14 | Discharge: 2014-06-14 | Disposition: A | Discharge status: Home / Self Care | Payer: Medicaid Other | Source: Ambulatory Visit | Attending: Urology | Admitting: Urology

## 2014-06-14 DIAGNOSIS — Z87442 Personal history of urinary calculi: Secondary | ICD-10-CM | POA: Insufficient documentation

## 2014-06-14 DIAGNOSIS — N201 Calculus of ureter: Secondary | ICD-10-CM | POA: Insufficient documentation

## 2014-06-14 DIAGNOSIS — I10 Essential (primary) hypertension: Secondary | ICD-10-CM | POA: Insufficient documentation

## 2014-06-14 HISTORY — DX: Personal history of other diseases of the digestive system: Z87.19

## 2014-06-14 HISTORY — DX: Personal history of urinary calculi: Z87.442

## 2014-06-14 HISTORY — DX: Personal history of peptic ulcer disease: Z87.11

## 2014-06-14 HISTORY — DX: Calculus of ureter: N20.1

## 2014-06-14 HISTORY — PX: CYSTOSCOPY/RETROGRADE/URETEROSCOPY/STONE EXTRACTION WITH BASKET: SHX5317

## 2014-06-14 SURGERY — CYSTOSCOPY, WITH CALCULUS REMOVAL USING BASKET
Anesthesia: General | Site: Ureter | Laterality: Left

## 2014-06-14 MED ORDER — CIPROFLOXACIN IN D5W 400 MG/200ML IV SOLN
400.0000 mg | INTRAVENOUS | Status: AC
  Administered 2014-06-14: 400 mg via INTRAVENOUS
  Filled 2014-06-14: qty 200

## 2014-06-14 MED ORDER — MIDAZOLAM HCL 2 MG/2ML IJ SOLN
INTRAMUSCULAR | Status: AC
  Filled 2014-06-14: qty 2

## 2014-06-14 MED ORDER — KETOROLAC TROMETHAMINE 30 MG/ML IJ SOLN
INTRAMUSCULAR | Status: DC | PRN
  Administered 2014-06-14: 30 mg via INTRAVENOUS

## 2014-06-14 MED ORDER — LIDOCAINE HCL (CARDIAC) 20 MG/ML IV SOLN
INTRAVENOUS | Status: DC | PRN
  Administered 2014-06-14: 50 mg via INTRAVENOUS

## 2014-06-14 MED ORDER — DEXAMETHASONE SODIUM PHOSPHATE 4 MG/ML IJ SOLN
INTRAMUSCULAR | Status: DC | PRN
  Administered 2014-06-14: 10 mg via INTRAVENOUS

## 2014-06-14 MED ORDER — FENTANYL CITRATE 0.05 MG/ML IJ SOLN
INTRAMUSCULAR | Status: AC
  Filled 2014-06-14: qty 4

## 2014-06-14 MED ORDER — IOHEXOL 350 MG/ML SOLN
INTRAVENOUS | Status: DC | PRN
  Administered 2014-06-14: 9 mL via INTRAVENOUS

## 2014-06-14 MED ORDER — CEPHALEXIN 500 MG PO CAPS: 500.0000 mg | ORAL_CAPSULE | Freq: Every day | ORAL | Status: DC

## 2014-06-14 MED ORDER — LACTATED RINGERS IV SOLN
INTRAVENOUS | Status: DC
  Filled 2014-06-14: qty 1000

## 2014-06-14 MED ORDER — ONDANSETRON HCL 4 MG/2ML IJ SOLN
INTRAMUSCULAR | Status: DC | PRN
  Administered 2014-06-14: 4 mg via INTRAVENOUS

## 2014-06-14 MED ORDER — PROPOFOL 10 MG/ML IV BOLUS
INTRAVENOUS | Status: DC | PRN
  Administered 2014-06-14: 200 mg via INTRAVENOUS

## 2014-06-14 MED ORDER — SODIUM CHLORIDE 0.9 % IR SOLN
Status: DC | PRN
  Administered 2014-06-14: 6000 mL via INTRAVESICAL

## 2014-06-14 MED ORDER — OXYCODONE-ACETAMINOPHEN 5-325 MG PO TABS
1.0000 | ORAL_TABLET | ORAL | Status: DC | PRN
  Administered 2014-06-14: 1 via ORAL
  Filled 2014-06-14: qty 1

## 2014-06-14 MED ORDER — METOCLOPRAMIDE HCL 5 MG/ML IJ SOLN
INTRAMUSCULAR | Status: DC | PRN
  Administered 2014-06-14: 10 mg via INTRAVENOUS

## 2014-06-14 MED ORDER — OXYCODONE-ACETAMINOPHEN 5-325 MG PO TABS: 1.0000 | ORAL_TABLET | ORAL | Status: DC | PRN

## 2014-06-14 MED ORDER — FENTANYL CITRATE 0.05 MG/ML IJ SOLN
INTRAMUSCULAR | Status: DC | PRN
  Administered 2014-06-14 (×2): 50 ug via INTRAVENOUS

## 2014-06-14 MED ORDER — FENTANYL CITRATE 0.05 MG/ML IJ SOLN
25.0000 ug | INTRAMUSCULAR | Status: DC | PRN
  Filled 2014-06-14: qty 1

## 2014-06-14 MED ORDER — LACTATED RINGERS IV SOLN
INTRAVENOUS | Status: DC
Start: 2014-06-14 — End: 2014-06-14
  Administered 2014-06-14 (×2): via INTRAVENOUS
  Filled 2014-06-14: qty 1000

## 2014-06-14 MED ORDER — MIDAZOLAM HCL 5 MG/5ML IJ SOLN
INTRAMUSCULAR | Status: DC | PRN
  Administered 2014-06-14: 2 mg via INTRAVENOUS

## 2014-06-14 MED ORDER — CIPROFLOXACIN IN D5W 400 MG/200ML IV SOLN
INTRAVENOUS | Status: AC
  Filled 2014-06-14: qty 200

## 2014-06-14 MED ORDER — OXYCODONE-ACETAMINOPHEN 5-325 MG PO TABS
ORAL_TABLET | ORAL | Status: AC
  Filled 2014-06-14: qty 1

## 2014-06-14 SURGICAL SUPPLY — 33 items
ADAPTER CATH URET PLST 4-6FR (CATHETERS) IMPLANT
ADPR CATH URET STRL DISP 4-6FR (CATHETERS)
BAG DRAIN URO-CYSTO SKYTR STRL (DRAIN) ×4 IMPLANT
BAG DRN UROCATH (DRAIN) ×2
BASKET LASER NITINOL 1.9FR (BASKET) IMPLANT
BASKET STNLS GEMINI 4WIRE 3FR (BASKET) IMPLANT
BASKET ZERO TIP NITINOL 2.4FR (BASKET) IMPLANT
BSKT STON RTRVL 120 1.9FR (BASKET)
BSKT STON RTRVL GEM 120X11 3FR (BASKET)
BSKT STON RTRVL ZERO TP 2.4FR (BASKET)
CANISTER SUCT LVC 12 LTR MEDI- (MISCELLANEOUS) ×3 IMPLANT
CATH INTERMIT  6FR 70CM (CATHETERS) ×3 IMPLANT
CATH URET 5FR 28IN CONE TIP (BALLOONS)
CATH URET 5FR 28IN OPEN ENDED (CATHETERS) IMPLANT
CATH URET 5FR 70CM CONE TIP (BALLOONS) IMPLANT
CLOTH BEACON ORANGE TIMEOUT ST (SAFETY) ×4 IMPLANT
DRAPE CAMERA CLOSED 9X96 (DRAPES) ×3 IMPLANT
ELECT REM PT RETURN 9FT ADLT (ELECTROSURGICAL)
ELECTRODE REM PT RTRN 9FT ADLT (ELECTROSURGICAL) IMPLANT
GLOVE BIO SURGEON STRL SZ7.5 (GLOVE) ×8 IMPLANT
GOWN STRL REUS W/ TWL LRG LVL3 (GOWN DISPOSABLE) ×1 IMPLANT
GOWN STRL REUS W/TWL LRG LVL3 (GOWN DISPOSABLE) ×4
GOWN STRL REUS W/TWL XL LVL3 (GOWN DISPOSABLE) ×6 IMPLANT
GUIDEWIRE 0.038 PTFE COATED (WIRE) ×1 IMPLANT
GUIDEWIRE ANG ZIPWIRE 038X150 (WIRE) IMPLANT
GUIDEWIRE STR DUAL SENSOR (WIRE) ×4 IMPLANT
IV NS IRRIG 3000ML ARTHROMATIC (IV SOLUTION) ×8 IMPLANT
PACK CYSTO (CUSTOM PROCEDURE TRAY) ×4 IMPLANT
SHEATH ACCESS URETERAL 38CM (SHEATH) IMPLANT
SHEATH ACCESS URETERAL 54CM (SHEATH) IMPLANT
SHEATH URET ACCESS 12FR/35CM (UROLOGICAL SUPPLIES) IMPLANT
SHEATH URET ACCESS 12FR/55CM (UROLOGICAL SUPPLIES) IMPLANT
STENT URET 6FRX24 CONTOUR (STENTS) ×3 IMPLANT

## 2014-06-14 NOTE — H&P (View-Only) (Signed)
Urology Consult   Physician requesting consult: Glynn OctaveStephen Rancour  Reason for consult: Kidney stone  History of Present Illness: Regina Harrison is a 28 y.o. female with a prior history of urolithiasis who presented to the emergency room [facility] at [facility] with a several hour history of acute onset of left flank pain with nausea and vomiting. There was no fever, chills or right-sided abdominal pain. The pain was reminiscent of prior calculi. Evaluation here revealed a left distal ureteral stone with proximal hydroureteronephrosis. Voided revealed pyuria , Leukocyte and nitrite positive. The patient is on her menses.  Urologic history is significant for prior left ureteroscopic stone extraction in 2009. She passed a stone this past summer.  She denies a history of voiding or storage urinary symptoms, hematuria, UTIs, STDs, urolithiasis, GU malignancy/trauma/surgery.  Past Medical History  Diagnosis Date  . Renal disorder   . Hypertension   . Preeclampsia   . Kidney stones   . Preterm labor   . Pregnancy induced hypertension     Past Surgical History  Procedure Laterality Date  . Kidney stone surgery       Current Hospital Medications: Scheduled Meds: Continuous Infusions: PRN Meds:.    Allergies: No Known Allergies  Family History  Problem Relation Age of Onset  . Hypertension Mother     Social History:  reports that she has never smoked. She does not have any smokeless tobacco history on file. She reports that she drinks alcohol. She reports that she does not use illicit drugs.  ROS: A complete review of systems was performed.  All systems are negative except for pertinent findings as noted.  Physical Exam:  Vital signs in last 24 hours: Temp:  [98.1 F (36.7 C)-98.8 F (37.1 C)] 98.1 F (36.7 C) (10/11 1400) Pulse Rate:  [68-96] 76 (10/11 1615) Resp:  [16-22] 16 (10/11 1615) BP: (110-128)/(66-86) 122/75 mmHg (10/11 1615) SpO2:  [96 %-100 %] 96 % (10/11  1615) Weight:  [74.844 kg (165 lb)] 74.844 kg (165 lb) (10/11 0919) General:  Alert and oriented, No acute distress HEENT: Normocephalic, atraumatic Neck: No JVD or lymphadenopathy Cardiovascular:  normal rate  Abdomen:  mildly obese, soft, mild left CVA and lower quadrant tenderness no rebound or guarding  Extremities: No edema Neurologic: Grossly intact  Laboratory Data:   Recent Labs  05/22/14 0945  WBC 6.9  HGB 12.8  HCT 37.1  PLT 296     Recent Labs  05/22/14 0945  NA 137  K 3.7  CL 103  GLUCOSE 120*  BUN 8  CALCIUM 9.0  CREATININE 0.63     Results for orders placed during the hospital encounter of 05/22/14 (from the past 24 hour(s))  CBC WITH DIFFERENTIAL     Status: None   Collection Time    05/22/14  9:45 AM      Result Value Ref Range   WBC 6.9  4.0 - 10.5 K/uL   RBC 4.47  3.87 - 5.11 MIL/uL   Hemoglobin 12.8  12.0 - 15.0 g/dL   HCT 09.837.1  11.936.0 - 14.746.0 %   MCV 83.0  78.0 - 100.0 fL   MCH 28.6  26.0 - 34.0 pg   MCHC 34.5  30.0 - 36.0 g/dL   RDW 82.913.5  56.211.5 - 13.015.5 %   Platelets 296  150 - 400 K/uL   Neutrophils Relative % 62  43 - 77 %   Neutro Abs 4.3  1.7 - 7.7 K/uL   Lymphocytes Relative 28  12 - 46 %   Lymphs Abs 2.0  0.7 - 4.0 K/uL   Monocytes Relative 6  3 - 12 %   Monocytes Absolute 0.4  0.1 - 1.0 K/uL   Eosinophils Relative 3  0 - 5 %   Eosinophils Absolute 0.2  0.0 - 0.7 K/uL   Basophils Relative 1  0 - 1 %   Basophils Absolute 0.0  0.0 - 0.1 K/uL  BASIC METABOLIC PANEL     Status: Abnormal   Collection Time    05/22/14  9:45 AM      Result Value Ref Range   Sodium 137  137 - 147 mEq/L   Potassium 3.7  3.7 - 5.3 mEq/L   Chloride 103  96 - 112 mEq/L   CO2 21  19 - 32 mEq/L   Glucose, Bld 120 (*) 70 - 99 mg/dL   BUN 8  6 - 23 mg/dL   Creatinine, Ser 8.290.63  0.50 - 1.10 mg/dL   Calcium 9.0  8.4 - 56.210.5 mg/dL   GFR calc non Af Amer >90  >90 mL/min   GFR calc Af Amer >90  >90 mL/min   Anion gap 13  5 - 15  PREGNANCY, URINE     Status:  None   Collection Time    05/22/14 10:27 AM      Result Value Ref Range   Preg Test, Ur NEGATIVE  NEGATIVE  URINALYSIS, ROUTINE W REFLEX MICROSCOPIC     Status: Abnormal   Collection Time    05/22/14 10:28 AM      Result Value Ref Range   Color, Urine RED (*) YELLOW   APPearance TURBID (*) CLEAR   Specific Gravity, Urine 1.024  1.005 - 1.030   pH 5.0  5.0 - 8.0   Glucose, UA 100 (*) NEGATIVE mg/dL   Hgb urine dipstick LARGE (*) NEGATIVE   Bilirubin Urine LARGE (*) NEGATIVE   Ketones, ur 40 (*) NEGATIVE mg/dL   Protein, ur >130>300 (*) NEGATIVE mg/dL   Urobilinogen, UA >8.6>8.0 (*) 0.0 - 1.0 mg/dL   Nitrite POSITIVE (*) NEGATIVE   Leukocytes, UA LARGE (*) NEGATIVE  URINE MICROSCOPIC-ADD ON     Status: Abnormal   Collection Time    05/22/14 10:28 AM      Result Value Ref Range   Squamous Epithelial / LPF RARE  RARE   WBC, UA 3-6  <3 WBC/hpf   RBC / HPF TOO NUMEROUS TO COUNT  <3 RBC/hpf   Bacteria, UA MANY (*) RARE  URINALYSIS, ROUTINE W REFLEX MICROSCOPIC     Status: Abnormal   Collection Time    05/22/14  3:18 PM      Result Value Ref Range   Color, Urine YELLOW  YELLOW   APPearance CLEAR  CLEAR   Specific Gravity, Urine 1.013  1.005 - 1.030   pH 6.5  5.0 - 8.0   Glucose, UA NEGATIVE  NEGATIVE mg/dL   Hgb urine dipstick SMALL (*) NEGATIVE   Bilirubin Urine NEGATIVE  NEGATIVE   Ketones, ur NEGATIVE  NEGATIVE mg/dL   Protein, ur NEGATIVE  NEGATIVE mg/dL   Urobilinogen, UA 0.2  0.0 - 1.0 mg/dL   Nitrite NEGATIVE  NEGATIVE   Leukocytes, UA NEGATIVE  NEGATIVE  URINE MICROSCOPIC-ADD ON     Status: Abnormal   Collection Time    05/22/14  3:18 PM      Result Value Ref Range   Squamous Epithelial / LPF FEW (*) RARE  RBC / HPF 3-6  <3 RBC/hpf   No results found for this or any previous visit (from the past 240 hour(s)).  Renal Function:  Recent Labs  05/22/14 0945  CREATININE 0.63   Estimated Creatinine Clearance: 100.6 ml/min (by C-G formula based on Cr of  0.63).  Radiologic Imaging: Ct Abdomen Pelvis Wo Contrast  05/22/2014   CLINICAL DATA:  Left flank pain and history of renal calculi.  EXAM: CT ABDOMEN AND PELVIS WITHOUT CONTRAST  TECHNIQUE: Multidetector CT imaging of the abdomen and pelvis was performed following the standard protocol without IV contrast.  COMPARISON:  01/14/2014  FINDINGS: There is moderate left hydronephrosis and hydroureter. As the ureter is followed into the pelvis, there is an elongated calculus within the distal ureter nearly at the ureterovesical junction and measuring approximately 6 mm in length. No other left-sided calculi. The right kidney and ureter are unremarkable. The bladder is decompressed.  Unenhanced appearance of the liver, gallbladder, pancreas, spleen, adrenal glands and bowel are within normal limits. No abnormal fluid collections. No incidental masses or enlarged lymph nodes. No hernias are identified. The uterus and adnexal regions are unremarkable by CT.  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 No significant listhesis. There is a mild leftward convex scoliosis of the lumbar spine.  IMPRESSION: Left hydronephrosis secondary to a 6 mm calculus located in the distal ureter and nearly at the ureterovesical junction. 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 No associated significant listhesis.   Electronically Signed   By: Irish LackGlenn  Yamagata M.D.   On: 05/22/2014 12:57   Voided urinalysis looked infected. Catheterized urinalysis was clear. Her CT images were personally reviewed. The left distal ureteral stone is approximately 6 mm long 3 mm wide.  I independently reviewed the above imaging studies.  Impression/Assessment:  Left distal ureteral stone, 6 x 3 mm. This should pass. Initial urinalysis was spurious-possible UTI. Catheterized urinalysis was clear  Plan:  I feel  comfortable letting her go home with pain medications, I don't think she needs antibiotics at this point, and I would like her to follow up with our office this week. She was given our card. She was also given tamsulosin and ondansetron.

## 2014-06-14 NOTE — Discharge Instructions (Addendum)
Post Anesthesia Home Care Instructions  Activity: Get plenty of rest for the remainder of the day. A responsible adult should stay with you for 24 hours following the procedure.  For the next 24 hours, DO NOT: -Drive a car -Paediatric nurse -Drink alcoholic beverages -Take any medication unless instructed by your physician -Make any legal decisions or sign important papers.  Meals: Start with liquid foods such as gelatin or soup. Progress to regular foods as tolerated. Avoid greasy, spicy, heavy foods. If nausea and/or vomiting occur, drink only clear liquids until the nausea and/or vomiting subsides. Call your physician if vomiting continues.  Special Instructions/Symptoms: Your throat may feel dry or sore from the anesthesia or the breathing tube placed in your throat during surgery. If this causes discomfort, gargle with warm salt water. The discomfort should disappear within 24 hours. [facility] [phone] Post Ureteroscopy With or Without Stent Instructions  Definitions:  Ureter: The duct that transports urine from the kidney to the bladder. Stent:   A plastic hollow tube that is placed into the ureter, from the kidney to the                 bladder to prevent the ureter from swelling shut.  GENERAL INSTRUCTIONS:  Despite the fact that no skin incisions were used, the area around the ureter and bladder is raw and irritated. The stent is a foreign body which will further irritate the bladder wall. This irritation is manifested by increased frequency of urination, both day and night, and by an increase in the urge to urinate. In some, the urge to urinate is present almost always. Sometimes the urge is strong enough that you may not be able to stop yourself from urinating. The only real cure is to remove the stent and then give time for the bladder wall to heal which can't be done until the danger of the ureter swelling shut has passed, which varies.  You may see  some blood in your urine while the stent is in place and a few days afterwards. Do not be alarmed, even if the urine was clear for a while. Get off your feet and drink lots of fluids until clearing occurs. If you start to pass clots or don't improve, call us.  DIET: You may return to your normal diet immediately. Because of the raw surface of your bladder, alcohol, spicy foods, acid type foods and drinks with caffeine may cause irritation or frequency and should be used in moderation. To keep your urine flowing freely and to avoid constipation, drink plenty of fluids during the day ( 8-10 glasses ). Tip: Avoid cranberry juice because it is very acidic.  ACTIVITY: Your physical activity doesn't need to be restricted. However, if you are very active, you may see some blood in your urine. We suggest that you reduce your activity under these circumstances until the bleeding has stopped.  BOWELS: It is important to keep your bowels regular during the postoperative period. Straining with bowel movements can cause bleeding. A bowel movement every other day is reasonable. Use a mild laxative if needed, such as Milk of Magnesia 2-3 tablespoons, or 2 Dulcolax tablets. Call if you continue to have problems. If you have been taking narcotics for pain, before, during or after your surgery, you may be constipated. Take a laxative if necessary.   MEDICATION: You should resume your pre-surgery medications unless told not to. In addition you will often be given an antibiotic to prevent infection. These  should be taken as prescribed until the bottles are finished unless you are having an unusual reaction to one of the drugs.  PROBLEMS YOU SHOULD REPORT TO US:  Fevers over 100.5 Fahrenheit.  Heavy bleeding, or clots ( See above notes about blood in urine ).  Inability to urinate.  Drug reactions ( hives, rash, nausea, vomiting, diarrhea ).  Severe burning or pain with urination that is not  improving.  FOLLOW-UP: You will need a follow-up appointment to monitor your progress. Call for this appointment at the number listed above. Usually the first appointment will be about three to fourteen days after your surgery.   1. You may see some blood in the urine and may have some burning with urination for 48-72 hours. You also may notice that you have to urinate more frequently or urgently after your procedure which is normal.  2. You should call should you develop an inability urinate, fever > 101, persistent nausea and vomiting that prevents you from eating or drinking to stay hydrated.  3. You have a stent. you will likely urinate more frequently and urgently until the stent is removed and you may experience some discomfort/pain in the lower abdomen and flank especially when urinating. You may take pain medication prescribed to you if needed for pain. You may also intermittently have blood in the urine until the stent is removed. It is essential that you follow up for stent removal as instructed. If the stent is left in place, this could result in permanent damage to and even loss of the kidney, as well as other complications like infections and kidney stones.  You will need to remove your stent by pulling on the string that is dangling on the outside on Friday morning. If the stent were to fall out earlier than that, that is ok, but you might notice some increased pain in your flank. This is from swelling (edema) of the ureter tube where we performed the surgery. You should try to control this pain with NSAIDs (ibuprofen, motrin, Dietary Guidelines to Help Prevent Kidney Stones Your risk of kidney stones can be decreased by adjusting the foods you eat. The most important thing you can do is drink enough fluid. You should drink enough fluid to keep your urine clear or pale yellow. The following guidelines provide specific information for the type of kidney stone you have had. GUIDELINES  ACCORDING TO TYPE OF KIDNEY STONE Calcium Oxalate Kidney Stones Reduce the amount of salt you eat. Foods that have a lot of salt cause your body to release excess calcium into your urine. The excess calcium can combine with a substance called oxalate to form kidney stones. Reduce the amount of animal protein you eat if the amount you eat is excessive. Animal protein causes your body to release excess calcium into your urine. Ask your dietitian how much protein from animal sources you should be eating. Avoid foods that are high in oxalates. If you take vitamins, they should have less than 500 mg of vitamin C. Your body turns vitamin C into oxalates. You do not need to avoid fruits and vegetables high in vitamin C. Calcium Phosphate Kidney Stones Reduce the amount of salt you eat to help prevent the release of excess calcium into your urine. Reduce the amount of animal protein you eat if the amount you eat is excessive. Animal protein causes your body to release excess calcium into your urine. Ask your dietitian how much protein from animal sources you should  be eating. Get enough calcium from food or take a calcium supplement (ask your dietitian for recommendations). Food sources of calcium that do not increase your risk of kidney stones include: Broccoli. Dairy products, such as cheese and yogurt. Pudding. Uric Acid Kidney Stones Do not have more than 6 oz of animal protein per day. FOOD SOURCES Animal Protein Sources Meat (all types). Poultry. Eggs. Fish, seafood. Foods High in [employer] [place] TravelersSalt Salt seasonings. Soy sauce. Teriyaki sauce. Cured and processed meats. Salted crackers and snack foods. Fast food. Canned soups and most canned foods. Foods High in Oxalates Grains: Amaranth. Barley. Grits. Wheat germ. Bran. Buckwheat flour. All bran cereals. Pretzels. Whole wheat bread. Vegetables: Beans (wax). Beets and beet greens. Collard  greens. Eggplant. [place]. Leeks. Okra. Parsley. Rutabagas. Spinach. Swiss chard. Tomato paste. Fried potatoes. Sweet potatoes. Fruits: Red currants. Figs. Kiwi. Rhubarb. Meat and Other Protein Sources: Beans (dried). Soy burgers and other soybean products. Miso. Nuts (peanuts, almonds, pecans, cashews, hazelnuts). Nut butters. Sesame seeds and tahini (paste made of sesame seeds). Poppy seeds. Beverages: Chocolate drink mixes. Soy milk. Instant iced tea. Juices made from high-oxalate fruits or vegetables. Other: Carob. Chocolate. Fruitcake. Marmalades. Document Released: 11/23/2010 Document Revised: 08/03/2013 Document Reviewed: 06/25/2013 [facility] Patient Information 2015 [employer], [place]. This information is not intended to replace advice given to you by your health care provider. Make sure you discuss any questions you have with your health care provider. 4. etc) or the narcotic pain meds. If the pain is uncontrolled, if you are having severe nausea/vomiting, or if you develop a fever, call the office if during business hours [phone]) or proceed to the ER if after hours/on the weekend.

## 2014-06-14 NOTE — Interval H&P Note (Signed)
History and Physical Interval Note:  06/14/2014 8:52 AM  Regina Harrison  has presented today for surgery, with the diagnosis of left ureteral stone. I reviewed the notes, labs and CT. Urine Cx in office was negative. I discussed with the patient the nature, potential benefits, risks and alternatives to left URS, HLL and stent, including side effects of the proposed treatment, the likelihood of the patient achieving the goals of the procedure, and any potential problems that might occur during the procedure or recuperation. Discussed ureteral injury and failure to gain RG access among others. All questions answered. Patient elects to proceed.     Jerilee FieldEskridge, [place]

## 2014-06-14 NOTE — Op Note (Signed)
Preoperative diagnosis: Left distal ureteral calculus  Postoperative diagnosis: Passed stone  Procedure:  1. Cystoscopy 2. Left semi-rigid ureteroscopy 3. Left ureteral stent placement (6Fr x 24 cm) 4. Left retrograde pyelography with interpretation  Surgeon: Karma GreaserMatt Janine Reller, MD  Resident: Adela LankE. Will Kirby, MD  Anesthesia: General  Complications: None  Intraoperative findings: Left retrograde pyelography demonstrated no filling defect in the left ureter, ureteroscopy illustrated no stone in the ureter. A left-sided stent with dangler attachment was left in place.  EBL: Minimal  Specimens: 1. None  Disposition of specimens: [facility] for stone analysis  Indication: Regina Harrison is a 28 y.o. female patient with urolithiasis. After reviewing the management options for treatment, they elected to proceed with the above surgical procedure(s). We have discussed the potential benefits and risks of the procedure, side effects of the proposed treatment, the likelihood of the patient achieving the goals of the procedure, and any potential problems that might occur during the procedure or recuperation. Informed consent has been obtained.  Description of procedure:  The patient was taken to the operating room and general anesthesia was induced.  The patient was placed in the dorsal lithotomy position, prepped and draped in the usual sterile fashion, and preoperative antibiotics were administered. A preoperative time-out was performed.   Cystourethroscopy was performed.  The patient's urethra was examined and was normal. The bladder was then systematically examined in its entirety. There was no evidence for any bladder tumors, stones, or other mucosal pathology.    Attention then turned to the Left ureteral orifice and a ureteral catheter was used to intubate the ureteral orifice.  Omnipaque contrast was injected through the ureteral catheter and a retrograde pyelogram was  performed with findings as dictated above.  A 0.38 sensor guidewire was then advanced up the Left ureter into the renal pelvis under fluoroscopic guidance.  This passed easily. It was left as a safety wire. A semirigid ureteroscope was then used to perform left-sided ureteroscopy. The ureter was not at all tight. Scope was able to be passed throughout the ureter up to the UPJ. No stone was seen within the ureter.  We elected to leave a stent with a dangler attached. 6 x 24 cm double-J ureteral stent was placed over the wire in usual fashion with a good curl appreciated but the renal pelvis as well as the bladder.  The bladder was then emptied and the procedure ended.  The patient appeared to tolerate the procedure well and without complications.  The patient was able to be awakened and transferred to the recovery unit in satisfactory condition.    Attending attestation: I was present for the entire procedure.

## 2014-06-14 NOTE — Anesthesia Postprocedure Evaluation (Signed)
  Anesthesia Post-op Note  Patient: Regina Harrison  Procedure(s) Performed: Procedure(s) (LRB): CYSTOSCOPY/RETROGRADE PYELOGRAM/LEFT URETEROSCOPYLEFT URETERAL STENT PLACEMENT (Left)  Patient Location: PACU  Anesthesia Type: General  Level of Consciousness: awake and alert   Airway and Oxygen Therapy: Patient Spontanous Breathing  Post-op Pain: mild  Post-op Assessment: Post-op Vital signs reviewed, Patient's Cardiovascular Status Stable, Respiratory Function Stable, Patent Airway and No signs of Nausea or vomiting  Last Vitals:  Filed Vitals:   06/14/14 1008  BP:   Pulse: 75  Temp:   Resp: 16    Post-op Vital Signs: stable   Complications: No apparent anesthesia complications

## 2014-06-14 NOTE — Interval H&P Note (Signed)
History and Physical Interval Note:  06/14/2014 8:13 AM  Regina MajorsEmily Z Gelb  has presented today for surgery, with the diagnosis of left ureteral stone  The various methods of treatment have been discussed with the patient and family. After consideration of risks, benefits and other options for treatment, the patient has consented to  Procedure(s): CYSTOSCOPY/RETROGRADE PYELOGRAM/LEFT URETEROSCOPY/STONE EXTRACTION WITH BASKET/LASER LITHOTRIPSY/LEFT URETERAL STENT PLACEMENT (Left) HOLMIUM LASER APPLICATION (N/A) as a surgical intervention .  The patient's history has been reviewed, patient examined, no change in status, stable for surgery.  I have reviewed the patient's chart and labs.  Questions were answered to the patient's satisfaction.     Craige CottaKirby, Will

## 2014-06-14 NOTE — Transfer of Care (Signed)
Immediate Anesthesia Transfer of Care Note  Patient: Regina Harrison  Procedure(s) Performed: Procedure(s) (LRB): CYSTOSCOPY/RETROGRADE PYELOGRAM/LEFT URETEROSCOPYLEFT URETERAL STENT PLACEMENT (Left)  Patient Location: PACU  Anesthesia Type: General  Level of Consciousness: awake, alert  and oriented  Airway & Oxygen Therapy: Patient Spontanous Breathing and Patient connected to face mask oxygen  Post-op Assessment: Report given to PACU RN and Post -op Vital signs reviewed and stable  Post vital signs: Reviewed and stable  Complications: No apparent anesthesia complications

## 2014-06-15 ENCOUNTER — Encounter (HOSPITAL_BASED_OUTPATIENT_CLINIC_OR_DEPARTMENT_OTHER): Payer: Self-pay | Admitting: Urology

## 2015-12-02 ENCOUNTER — Emergency Department (HOSPITAL_COMMUNITY): Payer: Medicaid Other

## 2015-12-02 ENCOUNTER — Emergency Department (HOSPITAL_COMMUNITY)
Admission: EM | Admit: 2015-12-02 | Discharge: 2015-12-02 | Disposition: A | Discharge status: Home / Self Care | Payer: Medicaid Other | Attending: Emergency Medicine | Admitting: Emergency Medicine

## 2015-12-02 ENCOUNTER — Encounter (HOSPITAL_COMMUNITY): Payer: Self-pay | Admitting: *Deleted

## 2015-12-02 DIAGNOSIS — R0789 Other chest pain: Secondary | ICD-10-CM | POA: Diagnosis not present

## 2015-12-02 DIAGNOSIS — Z8719 Personal history of other diseases of the digestive system: Secondary | ICD-10-CM | POA: Insufficient documentation

## 2015-12-02 DIAGNOSIS — R079 Chest pain, unspecified: Secondary | ICD-10-CM | POA: Diagnosis present

## 2015-12-02 DIAGNOSIS — Z3202 Encounter for pregnancy test, result negative: Secondary | ICD-10-CM | POA: Insufficient documentation

## 2015-12-02 DIAGNOSIS — Z87442 Personal history of urinary calculi: Secondary | ICD-10-CM | POA: Diagnosis not present

## 2015-12-02 LAB — CBC
HEMATOCRIT: 37.8 % (ref 36.0–46.0)
HEMOGLOBIN: 12.9 g/dL (ref 12.0–15.0)
MCH: 28.2 pg (ref 26.0–34.0)
MCHC: 34.1 g/dL (ref 30.0–36.0)
MCV: 82.7 fL (ref 78.0–100.0)
Platelets: 284 10*3/uL (ref 150–400)
RBC: 4.57 MIL/uL (ref 3.87–5.11)
RDW: 13.2 % (ref 11.5–15.5)
WBC: 8.6 10*3/uL (ref 4.0–10.5)

## 2015-12-02 LAB — BASIC METABOLIC PANEL
ANION GAP: 11 (ref 5–15)
BUN: 9 mg/dL (ref 6–20)
CALCIUM: 9.7 mg/dL (ref 8.9–10.3)
CO2: 22 mmol/L (ref 22–32)
Chloride: 104 mmol/L (ref 101–111)
Creatinine, Ser: 0.62 mg/dL (ref 0.44–1.00)
GLUCOSE: 95 mg/dL (ref 65–99)
POTASSIUM: 3.6 mmol/L (ref 3.5–5.1)
Sodium: 137 mmol/L (ref 135–145)

## 2015-12-02 LAB — I-STAT TROPONIN, ED: TROPONIN I, POC: 0 ng/mL (ref 0.00–0.08)

## 2015-12-02 LAB — I-STAT BETA HCG BLOOD, ED (MC, WL, AP ONLY)

## 2015-12-02 MED ORDER — NAPROXEN 250 MG PO TABS
500.0000 mg | ORAL_TABLET | Freq: Once | ORAL | Status: AC
  Administered 2015-12-02: 500 mg via ORAL
  Filled 2015-12-02: qty 2

## 2015-12-02 MED ORDER — CYCLOBENZAPRINE HCL 10 MG PO TABS
5.0000 mg | ORAL_TABLET | Freq: Once | ORAL | Status: AC
  Administered 2015-12-02: 5 mg via ORAL
  Filled 2015-12-02: qty 1

## 2015-12-02 MED ORDER — KETOROLAC TROMETHAMINE 60 MG/2ML IM SOLN: 60.0000 mg | Freq: Once | INTRAMUSCULAR | Status: DC

## 2015-12-02 MED ORDER — MELOXICAM 7.5 MG PO TABS: 7.5000 mg | ORAL_TABLET | Freq: Every day | ORAL | Status: AC

## 2015-12-02 NOTE — ED Notes (Signed)
Pt reports intermittent mid chest pain for several days, radiates into right shoulder and arm, increases when moving her right arm. Has sob when pain occurs. ekg done at triage.

## 2015-12-02 NOTE — ED Provider Notes (Signed)
CSN: [id number]     Arrival date & time 12/02/15  1257 History   First MD Initiated Contact with Patient 12/02/15 1504     Chief Complaint  Patient presents with  . Chest Pain     (Consider location/radiation/quality/duration/timing/severity/associated sxs/prior Treatment) HPI This is an otherwise healthy 30 year old female who presents complaining of chest pain. She reports that she has had sharp pains in her right upper chest wall, just lateral to her sternal border. She says this been going on for the last 3 days. She says that it is worse when she tilts her head backwards onto the left, then raises her arm. She says it is sharp at that point, and does not radiate. Pressure on this area relieves the pain. She has no history of recent unilateral leg swelling, does not take exogenous estrogen, has not had shortness of breath, cough, or hemoptysis. She has no history of blood clots. She has no cardiac history.   Past Medical History  Diagnosis Date  . Left ureteral calculus   . History of kidney stones   . History of gastric ulcer     AS TEEN--  RESOLVED   Past Surgical History  Procedure Laterality Date  . Cysto/  right retrograde pyleogram/  placement ureteral stent  01-28-2008  . Right ureteroscopic laser lithotripsy stone extraction/  right ureteral stent placement  04-06-2008  . Cystoscopy/retrograde/ureteroscopy/stone extraction with basket Left 06/14/2014    Procedure: CYSTOSCOPY/RETROGRADE PYELOGRAM/LEFT URETEROSCOPYLEFT URETERAL STENT PLACEMENT;  Surgeon: Jerilee FieldMatthew Eskridge, MD;  [facility] [place];  Service: Urology;  Laterality: Left;   Family History  Problem Relation Age of Onset  . Hypertension Mother    Social History  Substance Use Topics  . Smoking status: Never Smoker   . Smokeless tobacco: Never Used  . Alcohol Use: No   OB History    Gravida Para Term Preterm AB TAB SAB Ectopic Multiple Living   6 4 2 2 2  0 2 0 0 4     Review of Systems   Constitutional: Negative for chills, diaphoresis, activity change and appetite change.  Respiratory: Negative for choking, chest tightness and shortness of breath.   Cardiovascular: Positive for chest pain. Negative for palpitations and leg swelling.  All other systems reviewed and are negative.     Allergies  Review of patient's allergies indicates no known allergies.  Home Medications   Prior to Admission medications   Medication Sig Start Date End Date Taking? Authorizing Provider  ibuprofen (ADVIL,MOTRIN) 200 MG tablet Take 400 mg by mouth every 6 (six) hours as needed for mild pain.    Yes Historical Provider, MD  OVER THE COUNTER MEDICATION Place 1 patch onto the skin once. Thrive patch.  Ingredients: Forslean (coleus forskohlii), green coffee bean extract, garcinia cambogia, CoQ10, white willow bark, Cosmoperine (tetrahydropiperine), Satiereal saffron extract (crocus), and green tea extract.   Yes Historical Provider, MD  meloxicam (MOBIC) 7.5 MG tablet Take 1 tablet (7.5 mg total) by mouth daily. 12/02/15   Erskine Emeryhris Leverne Tessler, MD   BP 116/80 mmHg  Pulse 77  Temp(Src) 98.2 F (36.8 C) (Oral)  Resp 13  SpO2 99%  LMP 11/11/2015 Physical Exam  Constitutional: She is oriented to person, place, and time. She appears well-developed and well-nourished.  HENT:  Head: Normocephalic and atraumatic.  Eyes: Pupils are equal, round, and reactive to light.  Neck: Normal range of motion. Neck supple. No JVD present. No tracheal deviation present. No thyromegaly present.  Cardiovascular: Normal rate, regular  rhythm, normal heart sounds and intact distal pulses.   No murmur heard. Pulmonary/Chest: Effort normal and breath sounds normal. No respiratory distress. She has no wheezes. She has no rales. She exhibits no tenderness.  Pain with palpation along the right upper sternal border. Pain is reproduced by raising the patient's right arm  Abdominal: There is no tenderness. There is no rebound.   Musculoskeletal: Normal range of motion.  Neurological: She is alert and oriented to person, place, and time.  Skin: Skin is warm.  Psychiatric: She has a normal mood and affect.  Nursing note and vitals reviewed.   ED Course  Procedures (including critical care time) Labs Review Labs Reviewed  BASIC METABOLIC PANEL  CBC  I-STAT TROPOININ, ED  I-STAT BETA HCG BLOOD, ED ([facility], [facility], [facility] ONLY)    Imaging Review Dg Chest 2 View  12/02/2015  CLINICAL DATA:  Patient states upper midline chest pain x's 3 days. No cardiac history. SOB EXAM: CHEST  2 VIEW COMPARISON:  None. FINDINGS: The heart size and mediastinal contours are within normal limits. Both lungs are clear. The visualized skeletal structures are unremarkable. IMPRESSION: No active cardiopulmonary disease. Electronically Signed   By: Norva Pavlov M.D.   On: 12/02/2015 14:35   I have personally reviewed and evaluated these images and lab results as part of my medical decision-making.   EKG Interpretation   Date/Time:  Saturday December 02 2015 13:03:46 EDT Ventricular Rate:  87 PR Interval:  126 QRS Duration: 86 QT Interval:  370 QTC Calculation: 445 R Axis:   13 Text Interpretation:  Normal sinus rhythm with sinus arrhythmia  Nonspecific ST and T wave abnormality Abnormal ECG No prior EKG for  comparison  Confirmed by LIU MD, DANA ([id number]) on 12/02/2015 2:45:18 PM      MDM   Final diagnoses:  Chest wall pain    Patient presents complaining of chest pain. She is PERC negative, doubt pulmonary embolism. The pain is very well reproducible, and she has no cardiac risk factors. Doubt this is acute coronary syndrome. Troponin was checked and is negative. EKG, shows nonspecific ST and T-wave abnormalities, though I do not believe this presenting complaint is related to a cardiac etiology, will encourage outpatient follow-up with cardiology or her PCP.   Erskine Emery, MD 12/03/15 9811  Leta Baptist, MD 12/06/15 [phone]

## 2015-12-02 NOTE — ED Notes (Signed)
Pt. Wants more explanation on why she is being asked to follow up with cardiology. EDP notified.

## 2015-12-02 NOTE — Discharge Instructions (Signed)

## 2016-01-15 ENCOUNTER — Other Ambulatory Visit: Payer: Self-pay

## 2016-01-15 ENCOUNTER — Other Ambulatory Visit (HOSPITAL_COMMUNITY)
Admission: RE | Admit: 2016-01-15 | Discharge: 2016-01-15 | Disposition: A | Discharge status: Home / Self Care | Payer: Medicaid Other | Source: Ambulatory Visit | Attending: Family Medicine | Admitting: Family Medicine

## 2016-01-15 DIAGNOSIS — Z01419 Encounter for gynecological examination (general) (routine) without abnormal findings: Secondary | ICD-10-CM | POA: Insufficient documentation

## 2016-01-17 LAB — CYTOLOGY - PAP

## 2016-04-07 IMAGING — CT CT ABD-PELV W/O CM
2 of 4 series · 6 of 46 positions shown, 8 images · non-contrast
Comparison: 03/15/2012 and 01/28/2008

CLINICAL DATA: Left flank pain with possible kidney stones. Nausea
and vomiting.

EXAM:
CT ABDOMEN AND PELVIS WITHOUT CONTRAST
TECHNIQUE: Multidetector CT imaging of the abdomen and pelvis was performed
following the standard protocol without IV contrast.

[Series 204: cor · coronal · 0.50mm/px · 5 of 118 slices shown, 6 images]
[im 14/118  soft-tissue]
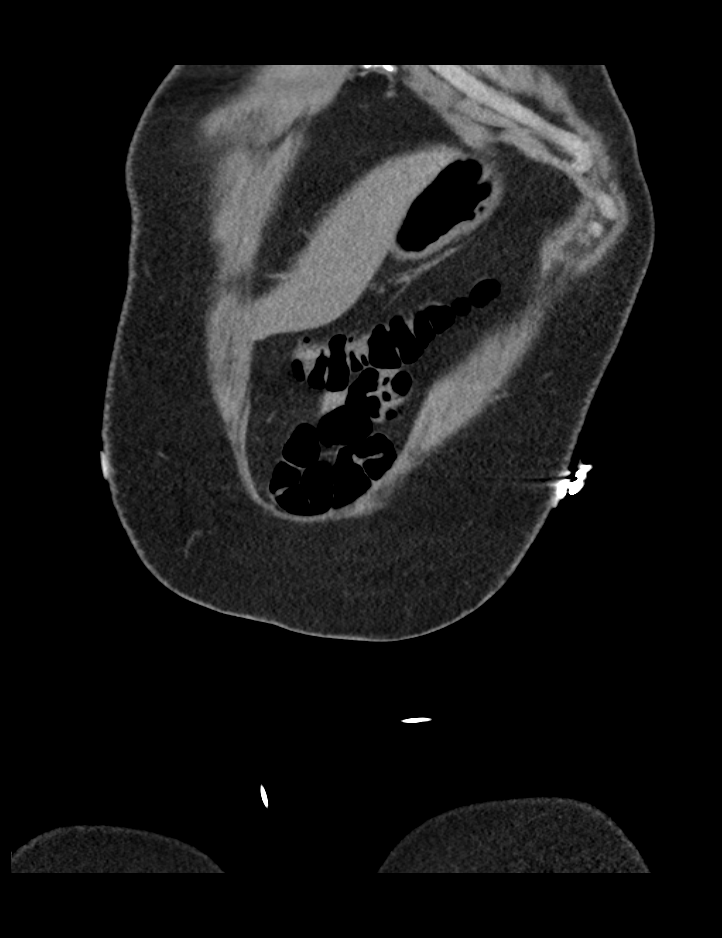
[im 14/118  bone]
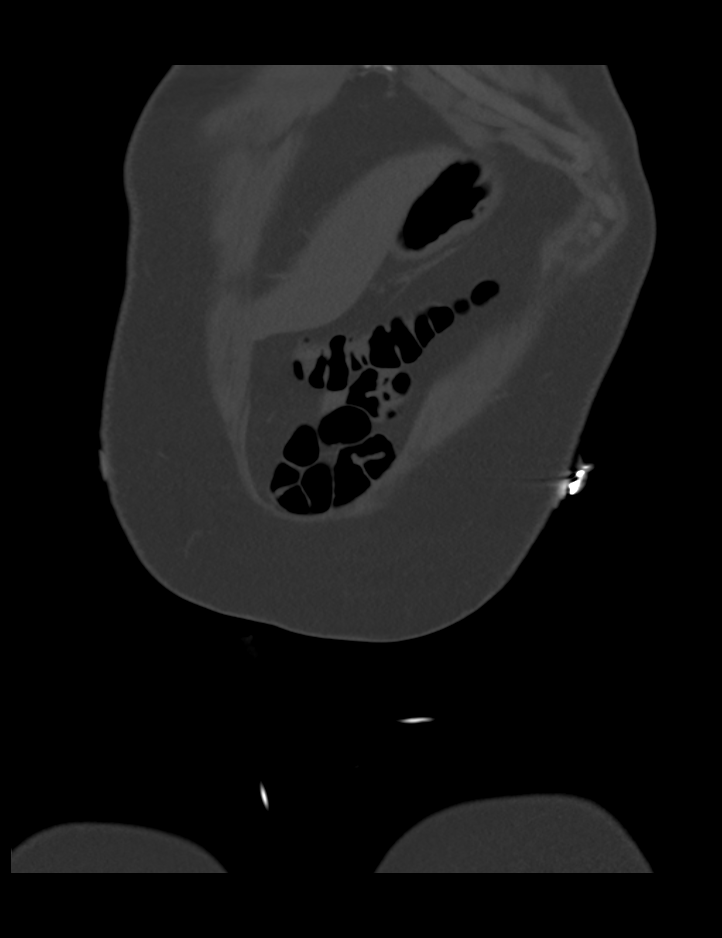
[im 40/118  soft-tissue]
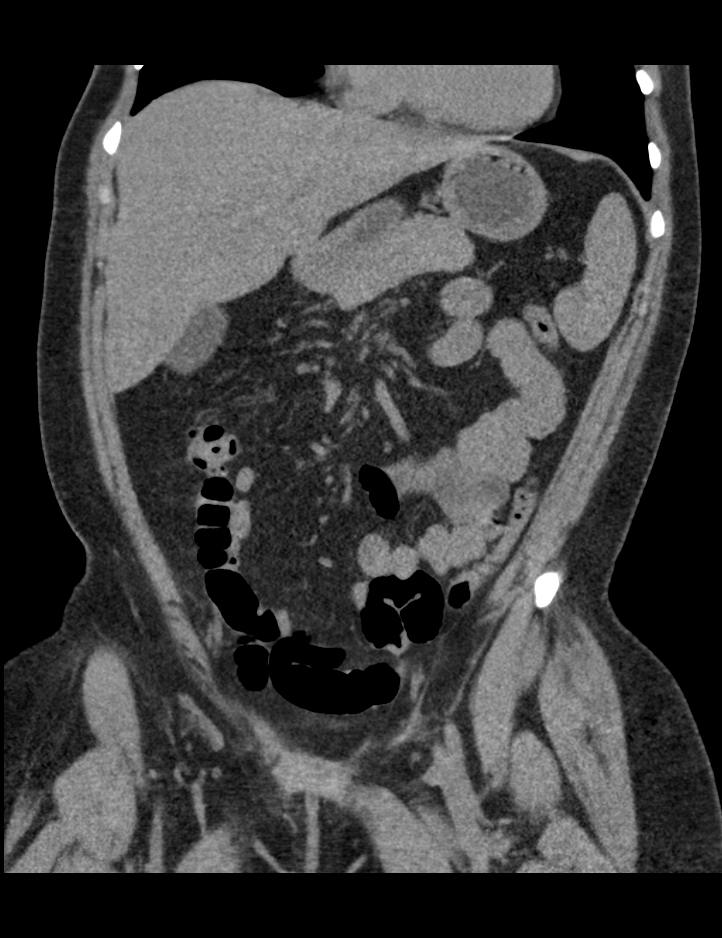
[im 66/118  soft-tissue]
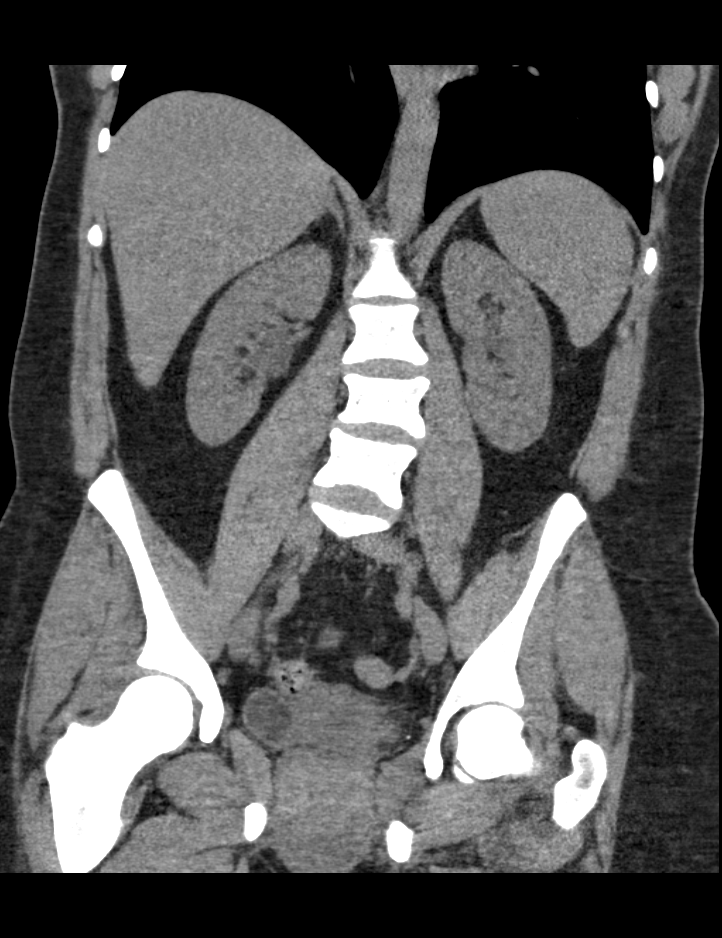
[im 79/118  soft-tissue]
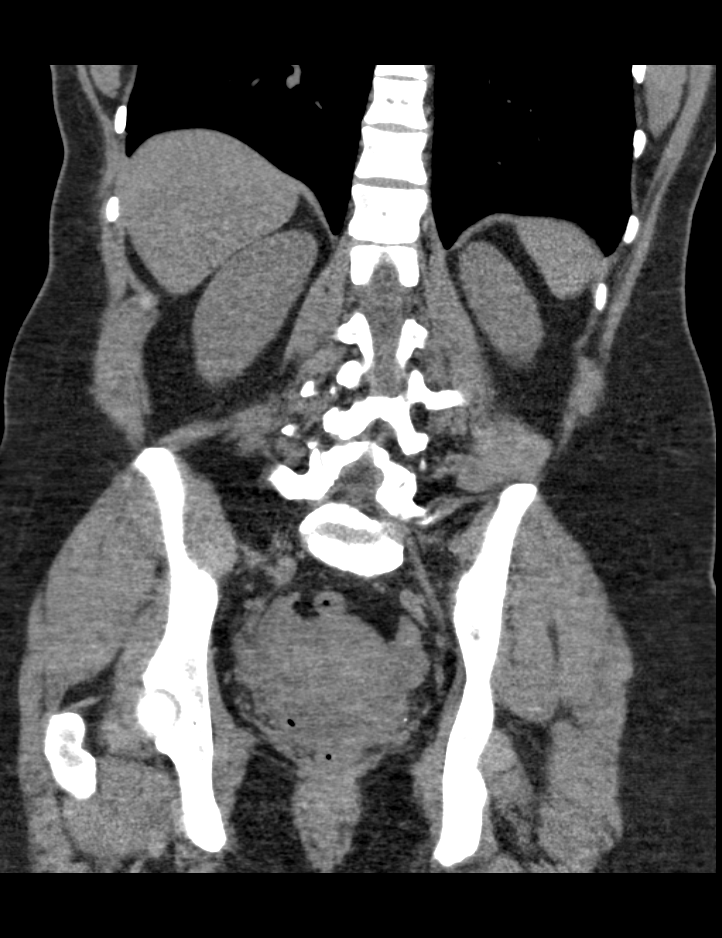
[im 105/118  soft-tissue]
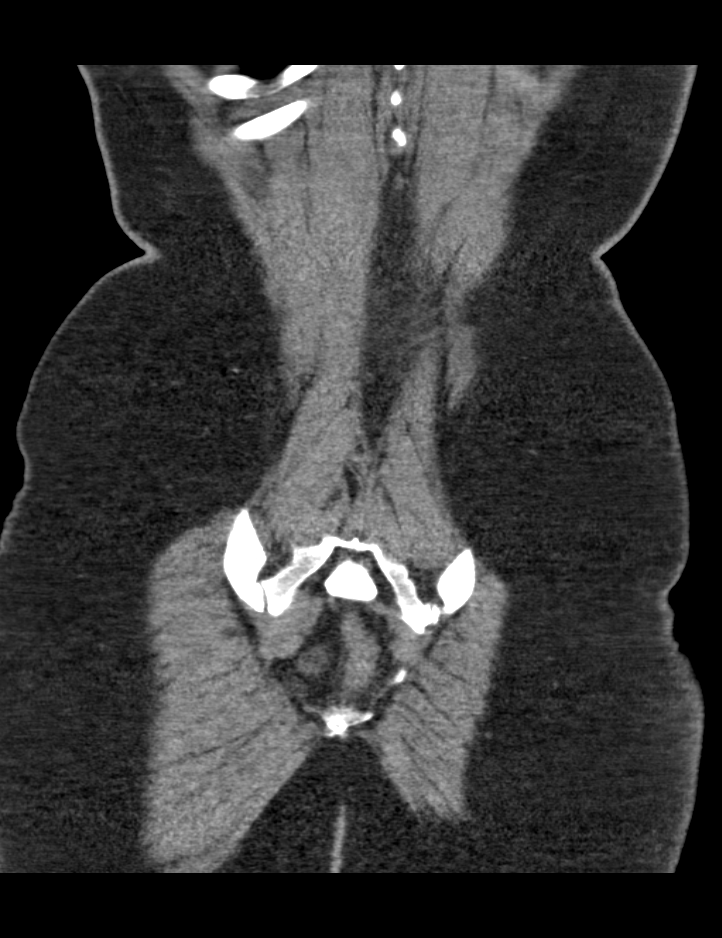

[Series 205: sagittal · sagittal · 0.50mm/px · 1 of 150 slices shown, 2 images]
[im 50/150  soft-tissue]
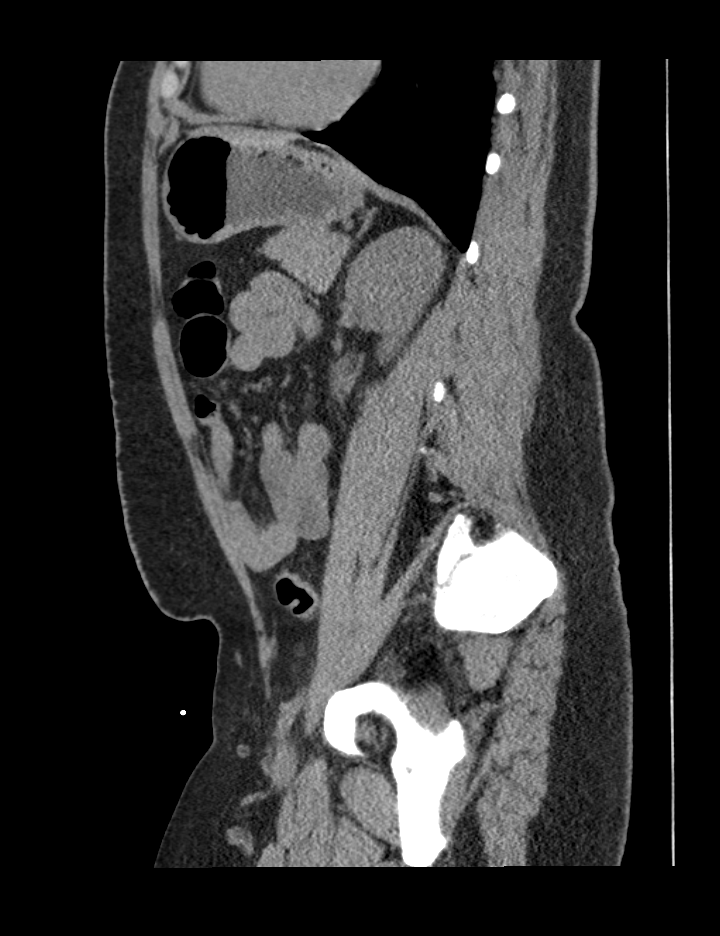
[im 50/150  bone]
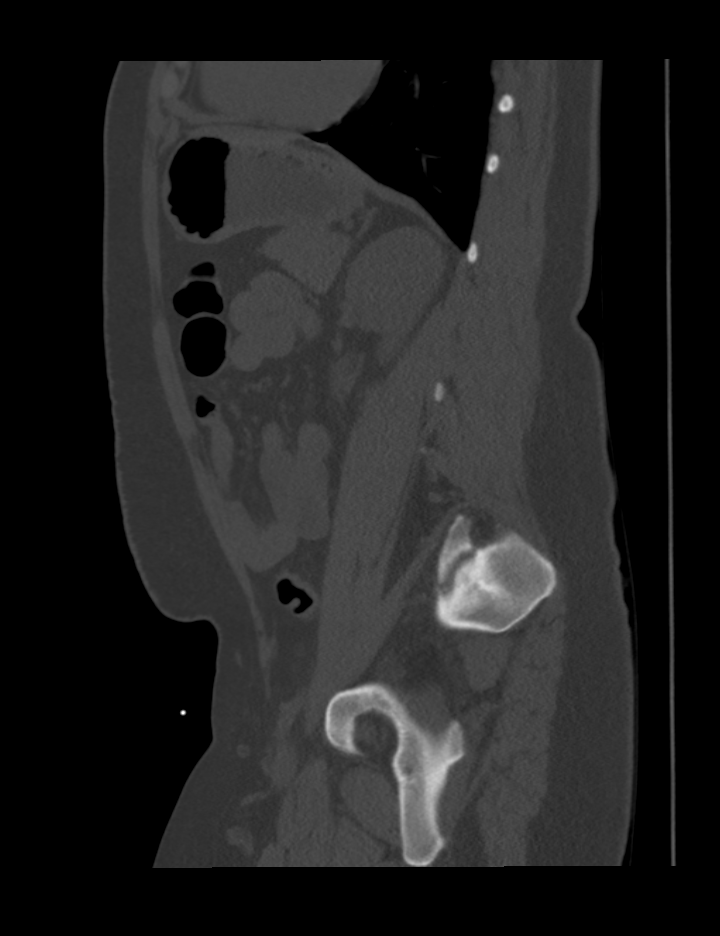

[6 of 46 positions shown; findings below may reference images not displayed]

FINDINGS: Lung bases are normal.

Abdominal images demonstrate a normal liver, spleen, pancreas,
adrenal glands and gallbladder. The appendix is normal. There is no
free fluid or focal inflammatory change.

Kidneys are normal in size without evidence of focal mass or
hydronephrosis. There is no perinephric inflammation or fluid. There
is a possible nonobstructing 1-2 mm stone of the lower pole
collecting system of the left kidney. Ureters are normal in caliber
without evidence of stones.

Pelvic images demonstrate the uterus, ovaries, bladder and
rectosigmoid colon to be within normal. There are several
phleboliths.

The remainder the exam is unremarkable.
IMPRESSION: No acute findings in the abdomen/ pelvis.

Possible 1-2 mm nonobstructing left renal stone.

## 2018-02-23 IMAGING — DX DG CHEST 2V
2 series · 2 of 2 positions shown · non-contrast
Comparison: None.

CLINICAL DATA: Patient states upper midline chest pain x's 3 days.
No cardiac history. SOB

EXAM:
CHEST  2 VIEW

[chest pa]
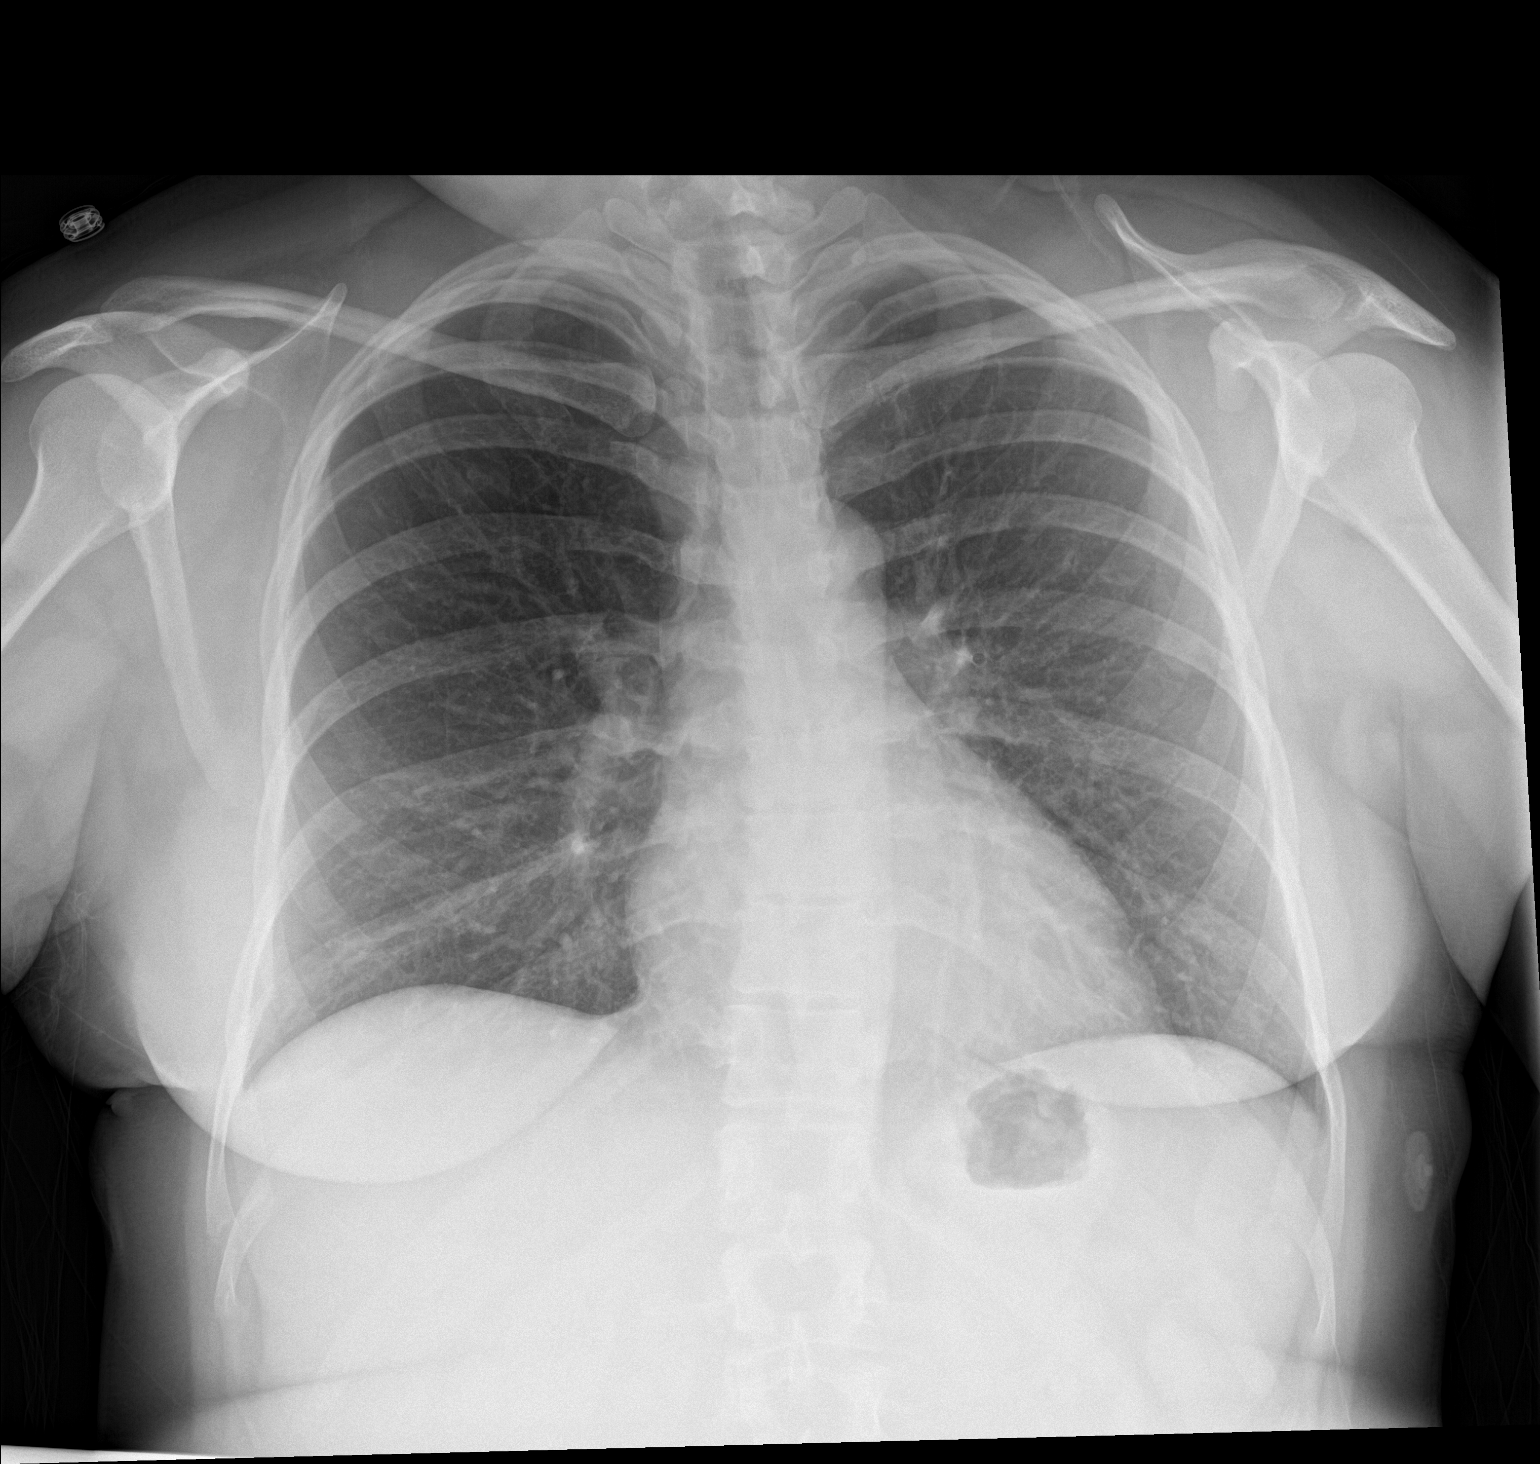

[chest lat]
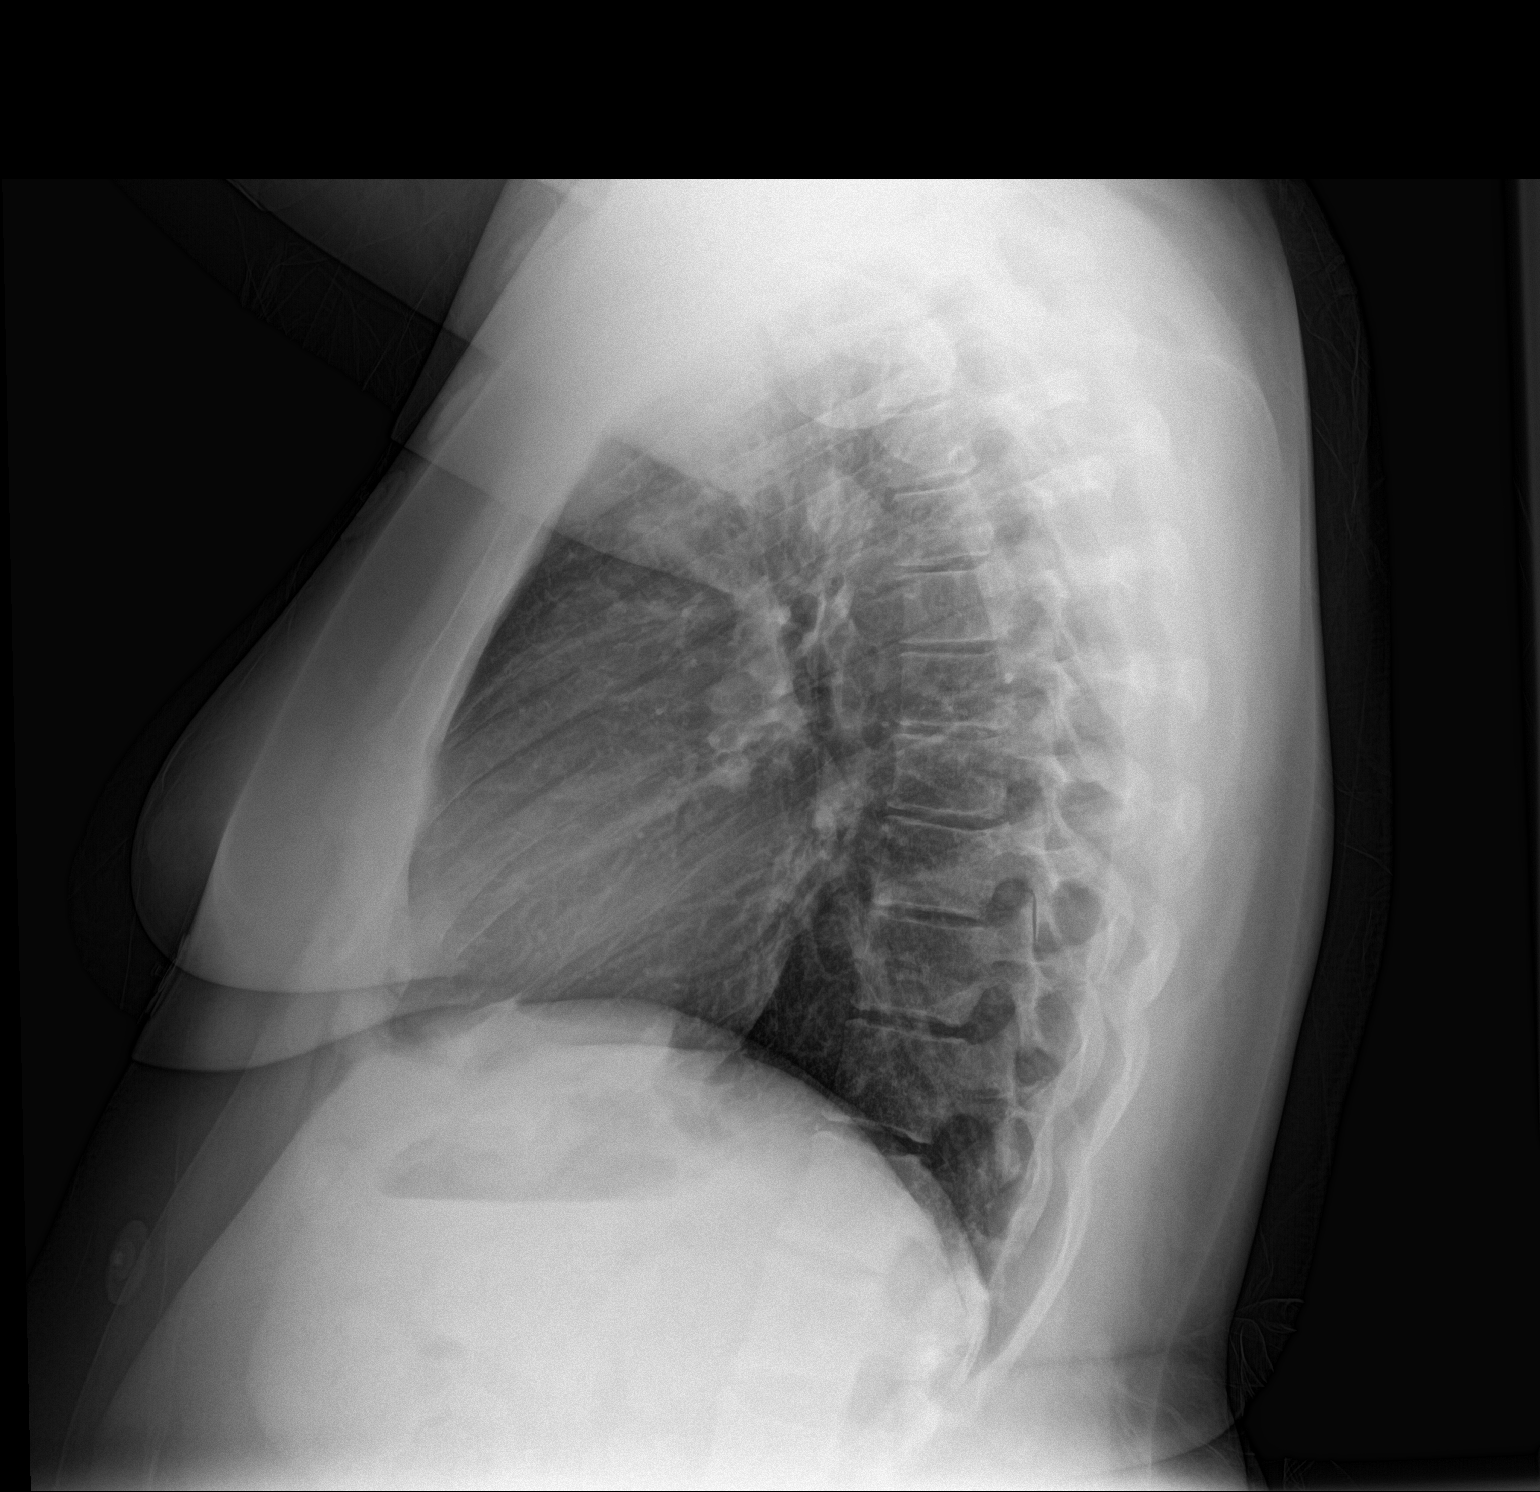

[2 of 2 positions shown; findings below may reference images not displayed]

FINDINGS: The heart size and mediastinal contours are within normal limits.
Both lungs are clear. The visualized skeletal structures are
unremarkable.
IMPRESSION: No active cardiopulmonary disease.
